# Patient Record
Sex: Female | Born: 1980 | Race: White | Hispanic: No | Marital: Single | Smoking: Former smoker
Health system: Southern US, Community
[De-identification: ages and names within clinical notes are randomized; demographics above are authoritative.]

## PROBLEM LIST (undated history)

## (undated) DIAGNOSIS — K219 Gastro-esophageal reflux disease without esophagitis: Secondary | ICD-10-CM

## (undated) DIAGNOSIS — F419 Anxiety disorder, unspecified: Secondary | ICD-10-CM

## (undated) HISTORY — PX: MOUTH SURGERY: SHX715

---

## 2004-10-08 HISTORY — PX: DILATION AND CURETTAGE OF UTERUS: SHX78

## 2004-10-30 ENCOUNTER — Emergency Department: Payer: Self-pay | Admitting: Emergency Medicine

## 2004-11-28 ENCOUNTER — Ambulatory Visit: Payer: Self-pay | Admitting: Unknown Physician Specialty

## 2004-12-12 ENCOUNTER — Emergency Department: Payer: Self-pay | Admitting: Emergency Medicine

## 2005-01-05 ENCOUNTER — Emergency Department: Payer: Self-pay | Admitting: Emergency Medicine

## 2009-08-03 ENCOUNTER — Emergency Department: Payer: Self-pay | Admitting: Unknown Physician Specialty

## 2010-09-30 ENCOUNTER — Emergency Department: Payer: Self-pay | Admitting: Emergency Medicine

## 2012-05-02 ENCOUNTER — Emergency Department: Payer: Self-pay

## 2012-11-15 ENCOUNTER — Emergency Department: Payer: Self-pay | Admitting: Emergency Medicine

## 2012-11-15 LAB — URINALYSIS, COMPLETE
Bilirubin,UR: NEGATIVE
Glucose,UR: NEGATIVE mg/dL (ref 0–75)
Nitrite: POSITIVE
Ph: 5 (ref 4.5–8.0)
Protein: NEGATIVE
RBC,UR: 215 /HPF (ref 0–5)
Specific Gravity: 1.015 (ref 1.003–1.030)
Squamous Epithelial: 1
WBC UR: 28 /HPF (ref 0–5)

## 2012-11-15 LAB — CBC
HGB: 12.7 g/dL (ref 12.0–16.0)
MCH: 29.6 pg (ref 26.0–34.0)
MCHC: 34.5 g/dL (ref 32.0–36.0)
MCV: 86 fL (ref 80–100)
RBC: 4.28 10*6/uL (ref 3.80–5.20)
RDW: 14.2 % (ref 11.5–14.5)
WBC: 8.6 10*3/uL (ref 3.6–11.0)

## 2012-11-15 LAB — BASIC METABOLIC PANEL
Anion Gap: 10 (ref 7–16)
Creatinine: 0.41 mg/dL — ABNORMAL LOW (ref 0.60–1.30)
EGFR (African American): >60
EGFR (Non-African Amer.): >60
Osmolality: 270 (ref 275–301)
Potassium: 3.6 mmol/L (ref 3.5–5.1)
Sodium: 137 mmol/L (ref 136–145)

## 2013-03-28 ENCOUNTER — Observation Stay: Payer: Self-pay | Admitting: Obstetrics and Gynecology

## 2013-04-09 ENCOUNTER — Inpatient Hospital Stay: Payer: Self-pay | Admitting: Obstetrics and Gynecology

## 2013-04-09 LAB — CBC WITH DIFFERENTIAL/PLATELET
Basophil #: 0 10*3/uL (ref 0.0–0.1)
Eosinophil #: 0 10*3/uL (ref 0.0–0.7)
Eosinophil %: 0.4 %
HGB: 11.6 g/dL — ABNORMAL LOW (ref 12.0–16.0)
MCH: 27.9 pg (ref 26.0–34.0)
MCHC: 33.8 g/dL (ref 32.0–36.0)
MCV: 83 fL (ref 80–100)
Monocyte #: 0.8 x10 3/mm (ref 0.2–0.9)
Monocyte %: 7.2 %
Neutrophil #: 8.7 10*3/uL — ABNORMAL HIGH (ref 1.4–6.5)
Neutrophil %: 79.8 %
Platelet: 177 10*3/uL (ref 150–440)
RBC: 4.17 10*6/uL (ref 3.80–5.20)
RDW: 14.2 % (ref 11.5–14.5)
WBC: 10.9 10*3/uL (ref 3.6–11.0)

## 2013-04-10 LAB — HEMATOCRIT: HCT: 32.1 % — ABNORMAL LOW (ref 35.0–47.0)

## 2014-10-08 NOTE — L&D Delivery Note (Signed)
Delivery Note At  a viable boy Olegario Shearer)  was delivered via  (NSVD , OA ).  APGAR: 9,9 ; weight 7LB 5 oz   Placenta status: Delivered Intact Shultz  Cord: 3 vessel with the following complications:none    Anesthesia:  Epidural Episiotomy:   none Lacerations:   superficial 1st degree no repair needed Suture Repair: none Est. Blood Loss (mL):   150 Mom to postpartum.  Baby to Couplet care / Skin to Skin   Delivered attended with Yolanda Bonine, CNM Cassandra Elder, RN-C, SNM.  Rolando Hessling Burr 06/22/2015, 2:32 PM

## 2014-10-26 LAB — OB RESULTS CONSOLE RUBELLA ANTIBODY, IGM: Rubella: NON-IMMUNE/NOT IMMUNE

## 2014-10-26 LAB — OB RESULTS CONSOLE ABO/RH

## 2014-10-26 LAB — OB RESULTS CONSOLE VARICELLA ZOSTER ANTIBODY, IGG: Varicella: IMMUNE

## 2014-10-26 LAB — OB RESULTS CONSOLE GC/CHLAMYDIA
Chlamydia: NEGATIVE
Gonorrhea: NEGATIVE

## 2014-10-26 LAB — OB RESULTS CONSOLE RPR: RPR: NONREACTIVE

## 2014-10-26 LAB — OB RESULTS CONSOLE HGB/HCT, BLOOD
HCT: 40 %
HEMOGLOBIN: 13.4 g/dL

## 2014-10-26 LAB — OB RESULTS CONSOLE PLATELET COUNT: Platelets: 258 10*3/uL

## 2014-10-26 LAB — OB RESULTS CONSOLE ANTIBODY SCREEN: ANTIBODY SCREEN: NEGATIVE

## 2014-10-26 LAB — OB RESULTS CONSOLE HEPATITIS B SURFACE ANTIGEN: Hepatitis B Surface Ag: NEGATIVE

## 2015-02-15 NOTE — H&P (Signed)
L&D Evaluation:  History Expanded:  HPI 34 yo G5P1031 at term w contractions since 1200.  No vb or rom.  [facility].  O+. TDaP UTD.   Blood Type (Maternal) O positive   Presents with contractions   Patient's Medical History HSV   Patient's Surgical History none   Medications Pre Natal Vitamins  Acyclovir   Allergies NKDA   Social History none   Family History Non-Contributory   ROS:  ROS All systems were reviewed.  HEENT, CNS, GI, GU, Respiratory, CV, Renal and Musculoskeletal systems were found to be normal.   Exam:  Vital Signs stable   General no apparent distress   Mental Status clear   Chest clear   Heart normal sinus rhythm, no murmur/gallop/rubs   Abdomen gravid, non-tender   Estimated Fetal Weight Average for gestational age   Back no CVAT   Edema no edema   Pelvic no external lesions, 7/90/-1   Mebranes Intact   FHT normal rate with no decels   Ucx regular   Impression:  Impression active labor   Plan:  Plan EFM/NST, monitor contractions and for cervical change, fluids   Comments Epidural, then spontaneous rupture of membranes, anticipate vag delivery   Electronic Signatures: Letitia LibraHarris, Caterra Ostroff Paul (MD)  (Signed 03-Jul-14 10:19)  Authored: L&D Evaluation   Last Updated: 03-Jul-14 10:19 by Letitia LibraHarris, Gwin Eagon Paul (MD)

## 2015-03-28 ENCOUNTER — Encounter: Payer: Self-pay | Admitting: *Deleted

## 2015-03-31 ENCOUNTER — Encounter: Payer: Self-pay | Admitting: Obstetrics and Gynecology

## 2015-03-31 ENCOUNTER — Ambulatory Visit (INDEPENDENT_AMBULATORY_CARE_PROVIDER_SITE_OTHER): Payer: No Typology Code available for payment source | Admitting: Obstetrics and Gynecology

## 2015-03-31 ENCOUNTER — Telehealth: Payer: Self-pay | Admitting: Obstetrics and Gynecology

## 2015-03-31 VITALS — BP 116/76 | HR 86 | Wt 187.4 lb

## 2015-03-31 DIAGNOSIS — B373 Candidiasis of vulva and vagina: Secondary | ICD-10-CM

## 2015-03-31 DIAGNOSIS — B3731 Acute candidiasis of vulva and vagina: Secondary | ICD-10-CM

## 2015-03-31 DIAGNOSIS — Z3493 Encounter for supervision of normal pregnancy, unspecified, third trimester: Secondary | ICD-10-CM | POA: Diagnosis not present

## 2015-03-31 LAB — POCT URINALYSIS DIPSTICK
Blood, UA: NEGATIVE
GLUCOSE UA: NEGATIVE
KETONES UA: NEGATIVE
Leukocytes, UA: NEGATIVE
Nitrite, UA: NEGATIVE
Protein, UA: 30
SPEC GRAV UA: 1.01
UROBILINOGEN UA: 0.2
pH, UA: 7

## 2015-03-31 MED ORDER — TETANUS-DIPHTH-ACELL PERTUSSIS 5-2.5-18.5 LF-MCG/0.5 IM SUSP: 0.5000 mL | Freq: Once | INTRAMUSCULAR | Status: DC

## 2015-03-31 MED ORDER — TERCONAZOLE 0.4 % VA CREA: 1.0000 | TOPICAL_CREAM | Freq: Every day | VAGINAL | Status: DC

## 2015-03-31 NOTE — Progress Notes (Signed)
Pt is c/o vaginal itching-outside vagina, some swelling b feet

## 2015-03-31 NOTE — Progress Notes (Signed)
ROB & Glucola- Tdap given- instructed to check with spouse and intended babysitter about their current TDAP; Blood consent signed; plans to name infant 'Ulanda Edison' , + yeast infection on exam with external vulva red- rx for terazol cream sent in;

## 2015-03-31 NOTE — Addendum Note (Signed)
Addended by: Rosine Beat L on: 03/31/2015 02:01 PM   Modules accepted: Orders

## 2015-04-01 LAB — HEMOGLOBIN AND HEMATOCRIT, BLOOD
HEMATOCRIT: 34.7 % (ref 34.0–46.6)
HEMOGLOBIN: 11.6 g/dL (ref 11.1–15.9)

## 2015-04-01 LAB — GLUCOSE, 1 HOUR GESTATIONAL: GESTATIONAL DIABETES SCREEN: 98 mg/dL (ref 65–139)

## 2015-04-14 ENCOUNTER — Ambulatory Visit (INDEPENDENT_AMBULATORY_CARE_PROVIDER_SITE_OTHER): Payer: No Typology Code available for payment source | Admitting: Obstetrics and Gynecology

## 2015-04-14 ENCOUNTER — Encounter: Payer: Self-pay | Admitting: Obstetrics and Gynecology

## 2015-04-14 VITALS — BP 128/75 | HR 100 | Wt 189.6 lb

## 2015-04-14 DIAGNOSIS — Z331 Pregnant state, incidental: Secondary | ICD-10-CM | POA: Diagnosis not present

## 2015-04-14 DIAGNOSIS — Z3493 Encounter for supervision of normal pregnancy, unspecified, third trimester: Secondary | ICD-10-CM

## 2015-04-14 LAB — POCT URINALYSIS DIPSTICK
Bilirubin, UA: NEGATIVE
Blood, UA: NEGATIVE
GLUCOSE UA: NEGATIVE
KETONES UA: NEGATIVE
Leukocytes, UA: NEGATIVE
Nitrite, UA: NEGATIVE
SPEC GRAV UA: 1.01
UROBILINOGEN UA: 0.2
pH, UA: 6.5

## 2015-04-14 NOTE — Progress Notes (Signed)
Pt denies any new complaints

## 2015-04-14 NOTE — Progress Notes (Signed)
ROB- doing well, all signs of yeast infection have resolved. Plans breastfeeding PP, desires OCPs (minipill) until finishes breastfeeding then Nuvaring., plans circumcision, FOB for labor support and desires epidural.

## 2015-04-28 ENCOUNTER — Encounter: Payer: Self-pay | Admitting: Obstetrics and Gynecology

## 2015-04-28 ENCOUNTER — Ambulatory Visit (INDEPENDENT_AMBULATORY_CARE_PROVIDER_SITE_OTHER): Payer: No Typology Code available for payment source | Admitting: Obstetrics and Gynecology

## 2015-04-28 VITALS — BP 109/71 | HR 74 | Wt 190.8 lb

## 2015-04-28 DIAGNOSIS — Z3493 Encounter for supervision of normal pregnancy, unspecified, third trimester: Secondary | ICD-10-CM

## 2015-04-28 LAB — POCT URINALYSIS DIPSTICK
Glucose, UA: NEGATIVE
KETONES UA: NEGATIVE
Leukocytes, UA: NEGATIVE
Nitrite, UA: NEGATIVE
RBC UA: NEGATIVE
Spec Grav, UA: 1.015
Urobilinogen, UA: 0.2
pH, UA: 6

## 2015-04-28 NOTE — Progress Notes (Signed)
Pt had a spell of nausea on Monday, lots of pelvic pressure, low back pain

## 2015-04-28 NOTE — Progress Notes (Signed)
ROB-doing well, unsure cause of nausea and back pain- reviewed s/s of UTI and vaginitis;

## 2015-05-13 ENCOUNTER — Ambulatory Visit (INDEPENDENT_AMBULATORY_CARE_PROVIDER_SITE_OTHER): Payer: No Typology Code available for payment source | Admitting: Obstetrics and Gynecology

## 2015-05-13 ENCOUNTER — Encounter: Payer: Self-pay | Admitting: Obstetrics and Gynecology

## 2015-05-13 VITALS — BP 111/73 | HR 97 | Wt 198.3 lb

## 2015-05-13 DIAGNOSIS — Z3493 Encounter for supervision of normal pregnancy, unspecified, third trimester: Secondary | ICD-10-CM

## 2015-05-13 LAB — POCT URINALYSIS DIPSTICK
Bilirubin, UA: NEGATIVE
GLUCOSE UA: NEGATIVE
KETONES UA: NEGATIVE
Leukocytes, UA: NEGATIVE
Nitrite, UA: NEGATIVE
Protein, UA: 6
Spec Grav, UA: 1.01
Urobilinogen, UA: 0.2
pH, UA: 6

## 2015-05-13 NOTE — Progress Notes (Signed)
ROB- doing well, discussed wt gain- to increase water intake and reduce carb's; will plan on delivery at due date due to previous 9#6oz female at [redacted]w[redacted]d

## 2015-05-13 NOTE — Progress Notes (Signed)
Pt has gained some weight this time, having some pelvic pressure

## 2015-05-27 ENCOUNTER — Encounter: Payer: Self-pay | Admitting: Obstetrics and Gynecology

## 2015-05-27 ENCOUNTER — Ambulatory Visit (INDEPENDENT_AMBULATORY_CARE_PROVIDER_SITE_OTHER): Payer: No Typology Code available for payment source | Admitting: Obstetrics and Gynecology

## 2015-05-27 VITALS — BP 115/71 | HR 76 | Wt 199.8 lb

## 2015-05-27 DIAGNOSIS — Z36 Encounter for antenatal screening of mother: Secondary | ICD-10-CM

## 2015-05-27 DIAGNOSIS — O26893 Other specified pregnancy related conditions, third trimester: Secondary | ICD-10-CM | POA: Insufficient documentation

## 2015-05-27 DIAGNOSIS — Z113 Encounter for screening for infections with a predominantly sexual mode of transmission: Secondary | ICD-10-CM

## 2015-05-27 DIAGNOSIS — Z3493 Encounter for supervision of normal pregnancy, unspecified, third trimester: Secondary | ICD-10-CM

## 2015-05-27 DIAGNOSIS — Z3685 Encounter for antenatal screening for Streptococcus B: Secondary | ICD-10-CM

## 2015-05-27 DIAGNOSIS — R12 Heartburn: Secondary | ICD-10-CM

## 2015-05-27 LAB — POCT URINALYSIS DIPSTICK
BILIRUBIN UA: NEGATIVE
Blood, UA: NEGATIVE
GLUCOSE UA: NEGATIVE
Ketones, UA: NEGATIVE
LEUKOCYTES UA: NEGATIVE
Nitrite, UA: NEGATIVE
Protein, UA: NEGATIVE
Spec Grav, UA: 1.01
Urobilinogen, UA: 0.2
pH, UA: 6.5

## 2015-05-27 MED ORDER — RANITIDINE HCL 150 MG PO TABS: 150.0000 mg | ORAL_TABLET | Freq: Two times a day (BID) | ORAL | Status: DC

## 2015-05-27 NOTE — Progress Notes (Signed)
ROB-c/o heartburn, denies any other complaints

## 2015-05-27 NOTE — Progress Notes (Signed)
ROB- cultures obtained, FMLA papers brought in; labor precautions reiterated.

## 2015-05-29 LAB — STREP GP B NAA: Strep Gp B NAA: NEGATIVE

## 2015-05-29 LAB — GC/CHLAMYDIA PROBE AMP
Chlamydia trachomatis, NAA: NEGATIVE
NEISSERIA GONORRHOEAE BY PCR: NEGATIVE

## 2015-06-03 ENCOUNTER — Encounter: Payer: Self-pay | Admitting: Obstetrics and Gynecology

## 2015-06-03 ENCOUNTER — Ambulatory Visit (INDEPENDENT_AMBULATORY_CARE_PROVIDER_SITE_OTHER): Payer: No Typology Code available for payment source | Admitting: Obstetrics and Gynecology

## 2015-06-03 VITALS — BP 106/70 | HR 68 | Wt 201.9 lb

## 2015-06-03 DIAGNOSIS — Z331 Pregnant state, incidental: Secondary | ICD-10-CM

## 2015-06-03 DIAGNOSIS — R894 Abnormal immunological findings in specimens from other organs, systems and tissues: Secondary | ICD-10-CM

## 2015-06-03 DIAGNOSIS — R768 Other specified abnormal immunological findings in serum: Secondary | ICD-10-CM

## 2015-06-03 LAB — POCT URINALYSIS DIPSTICK
Blood, UA: NEGATIVE
Glucose, UA: NEGATIVE
KETONES UA: NEGATIVE
Leukocytes, UA: NEGATIVE
Nitrite, UA: NEGATIVE
PH UA: 6
SPEC GRAV UA: 1.025
UROBILINOGEN UA: 0.2

## 2015-06-03 MED ORDER — FLUCONAZOLE 150 MG PO TABS: 150.0000 mg | ORAL_TABLET | Freq: Once | ORAL | Status: DC

## 2015-06-03 MED ORDER — ACYCLOVIR 400 MG PO TABS: 400.0000 mg | ORAL_TABLET | Freq: Two times a day (BID) | ORAL | Status: DC

## 2015-06-03 NOTE — Progress Notes (Signed)
ROB- yeast infection has returned; starting HSV prophylaxis; will plan IOL at term due to previous 9#6oz infant with extensive vaginal repair (scheduled 9/14 at 630am). Work note to reduce hours to 5/day for remainder of pregnancy.

## 2015-06-03 NOTE — Progress Notes (Signed)
ROB-pt is having some contractions, c/o vaginal itching

## 2015-06-09 ENCOUNTER — Encounter: Payer: No Typology Code available for payment source | Admitting: Obstetrics and Gynecology

## 2015-06-09 ENCOUNTER — Encounter: Payer: Self-pay | Admitting: Obstetrics and Gynecology

## 2015-06-09 ENCOUNTER — Ambulatory Visit (INDEPENDENT_AMBULATORY_CARE_PROVIDER_SITE_OTHER): Payer: No Typology Code available for payment source | Admitting: Obstetrics and Gynecology

## 2015-06-09 VITALS — BP 117/72 | HR 75 | Wt 202.0 lb

## 2015-06-09 DIAGNOSIS — Z331 Pregnant state, incidental: Secondary | ICD-10-CM

## 2015-06-09 LAB — POCT URINALYSIS DIPSTICK
Bilirubin, UA: NEGATIVE
Glucose, UA: NEGATIVE
Ketones, UA: NEGATIVE
LEUKOCYTES UA: NEGATIVE
Nitrite, UA: NEGATIVE
PH UA: 7
Spec Grav, UA: 1.01
UROBILINOGEN UA: 0.2

## 2015-06-09 NOTE — Progress Notes (Signed)
ROB- doing well, labor precautions reiterated. 

## 2015-06-09 NOTE — Progress Notes (Signed)
ROB- pt is having some pelvic pressure 

## 2015-06-17 ENCOUNTER — Encounter: Payer: Self-pay | Admitting: Obstetrics and Gynecology

## 2015-06-17 ENCOUNTER — Ambulatory Visit (INDEPENDENT_AMBULATORY_CARE_PROVIDER_SITE_OTHER): Payer: No Typology Code available for payment source | Admitting: Obstetrics and Gynecology

## 2015-06-17 VITALS — BP 118/72 | HR 77 | Wt 207.6 lb

## 2015-06-17 DIAGNOSIS — Z3493 Encounter for supervision of normal pregnancy, unspecified, third trimester: Secondary | ICD-10-CM

## 2015-06-17 LAB — POCT URINALYSIS DIPSTICK
Bilirubin, UA: NEGATIVE
Blood, UA: NEGATIVE
Glucose, UA: NEGATIVE
Ketones, UA: NEGATIVE
LEUKOCYTES UA: NEGATIVE
NITRITE UA: NEGATIVE
PROTEIN UA: NEGATIVE
Spec Grav, UA: 1.01
UROBILINOGEN UA: 0.2
pH, UA: 6

## 2015-06-17 MED ORDER — INFLUENZA VAC SPLIT QUAD 0.5 ML IM SUSY
0.5000 mL | PREFILLED_SYRINGE | Freq: Once | INTRAMUSCULAR | Status: AC
  Administered 2015-06-17: 0.5 mL via INTRAMUSCULAR

## 2015-06-17 NOTE — Progress Notes (Signed)
ROB- doing well, labor precautions 

## 2015-06-17 NOTE — Progress Notes (Signed)
ROB- denies any new complaints 

## 2015-06-22 ENCOUNTER — Inpatient Hospital Stay: Payer: PRIVATE HEALTH INSURANCE | Admitting: Anesthesiology

## 2015-06-22 ENCOUNTER — Encounter: Payer: Self-pay | Admitting: *Deleted

## 2015-06-22 ENCOUNTER — Inpatient Hospital Stay
Admission: AD | Admit: 2015-06-22 | Discharge: 2015-06-23 | DRG: 775 | Disposition: A | Discharge status: Home / Self Care | Payer: PRIVATE HEALTH INSURANCE | Source: Ambulatory Visit | Attending: Obstetrics and Gynecology | Admitting: Obstetrics and Gynecology

## 2015-06-22 DIAGNOSIS — O480 Post-term pregnancy: Secondary | ICD-10-CM | POA: Diagnosis present

## 2015-06-22 DIAGNOSIS — Z3493 Encounter for supervision of normal pregnancy, unspecified, third trimester: Secondary | ICD-10-CM

## 2015-06-22 DIAGNOSIS — Z3A40 40 weeks gestation of pregnancy: Secondary | ICD-10-CM | POA: Diagnosis not present

## 2015-06-22 DIAGNOSIS — O99334 Smoking (tobacco) complicating childbirth: Secondary | ICD-10-CM | POA: Diagnosis present

## 2015-06-22 DIAGNOSIS — Z803 Family history of malignant neoplasm of breast: Secondary | ICD-10-CM | POA: Diagnosis not present

## 2015-06-22 LAB — TYPE AND SCREEN
ABO/RH(D): O POS
Antibody Screen: NEGATIVE

## 2015-06-22 LAB — CBC
HEMATOCRIT: 33.6 % — AB (ref 35.0–47.0)
HEMOGLOBIN: 11.1 g/dL — AB (ref 12.0–16.0)
MCH: 27.7 pg (ref 26.0–34.0)
MCHC: 33 g/dL (ref 32.0–36.0)
MCV: 84 fL (ref 80.0–100.0)
Platelets: 200 10*3/uL (ref 150–440)
RBC: 4 MIL/uL (ref 3.80–5.20)
RDW: 13.7 % (ref 11.5–14.5)
WBC: 6.4 10*3/uL (ref 3.6–11.0)

## 2015-06-22 LAB — ABO/RH: ABO/RH(D): O POS

## 2015-06-22 MED ORDER — AMMONIA AROMATIC IN INHA
RESPIRATORY_TRACT | Status: AC
  Filled 2015-06-22: qty 10

## 2015-06-22 MED ORDER — OXYTOCIN 10 UNIT/ML IJ SOLN
INTRAMUSCULAR | Status: AC
  Filled 2015-06-22: qty 2

## 2015-06-22 MED ORDER — BUPIVACAINE HCL (PF) 0.25 % IJ SOLN
INTRAMUSCULAR | Status: DC | PRN
  Administered 2015-06-22: 5 mL via EPIDURAL
  Administered 2015-06-22: 2 mL via EPIDURAL

## 2015-06-22 MED ORDER — DIBUCAINE 1 % RE OINT: 1.0000 "application " | TOPICAL_OINTMENT | RECTAL | Status: DC | PRN

## 2015-06-22 MED ORDER — OXYCODONE-ACETAMINOPHEN 5-325 MG PO TABS
2.0000 | ORAL_TABLET | ORAL | Status: DC | PRN
  Administered 2015-06-23: 2 via ORAL
  Filled 2015-06-22: qty 2

## 2015-06-22 MED ORDER — ACETAMINOPHEN 325 MG PO TABS
650.0000 mg | ORAL_TABLET | ORAL | Status: DC | PRN
  Administered 2015-06-22: 650 mg via ORAL
  Filled 2015-06-22: qty 2

## 2015-06-22 MED ORDER — MISOPROSTOL 200 MCG PO TABS
ORAL_TABLET | ORAL | Status: AC
  Filled 2015-06-22: qty 4

## 2015-06-22 MED ORDER — LIDOCAINE-EPINEPHRINE (PF) 1.5 %-1:200000 IJ SOLN
INTRAMUSCULAR | Status: DC | PRN
  Administered 2015-06-22 (×2): 2 mL via PERINEURAL

## 2015-06-22 MED ORDER — ONDANSETRON HCL 4 MG/2ML IJ SOLN: 4.0000 mg | Freq: Four times a day (QID) | INTRAMUSCULAR | Status: DC | PRN

## 2015-06-22 MED ORDER — LACTATED RINGERS IV SOLN: 500.0000 mL | INTRAVENOUS | Status: DC | PRN

## 2015-06-22 MED ORDER — ONDANSETRON HCL 4 MG/2ML IJ SOLN: 4.0000 mg | INTRAMUSCULAR | Status: DC | PRN

## 2015-06-22 MED ORDER — OXYTOCIN 40 UNITS IN LACTATED RINGERS INFUSION - SIMPLE MED
1.0000 m[IU]/min | INTRAVENOUS | Status: DC
  Administered 2015-06-22: 1 m[IU]/min via INTRAVENOUS
  Filled 2015-06-22: qty 1000

## 2015-06-22 MED ORDER — BUTORPHANOL TARTRATE 1 MG/ML IJ SOLN
INTRAMUSCULAR | Status: AC
  Administered 2015-06-22: 1 mg via INTRAVENOUS
  Filled 2015-06-22: qty 1

## 2015-06-22 MED ORDER — LIDOCAINE HCL (PF) 1 % IJ SOLN
30.0000 mL | INTRAMUSCULAR | Status: DC | PRN
  Filled 2015-06-22: qty 30

## 2015-06-22 MED ORDER — OXYTOCIN 40 UNITS IN LACTATED RINGERS INFUSION - SIMPLE MED: 62.5000 mL/h | INTRAVENOUS | Status: DC | PRN

## 2015-06-22 MED ORDER — OXYCODONE-ACETAMINOPHEN 5-325 MG PO TABS
1.0000 | ORAL_TABLET | ORAL | Status: DC | PRN
  Administered 2015-06-23 (×2): 1 via ORAL
  Filled 2015-06-22 (×2): qty 1

## 2015-06-22 MED ORDER — FENTANYL 2.5 MCG/ML W/ROPIVACAINE 0.2% IN NS 100 ML EPIDURAL INFUSION (ARMC-ANES)
EPIDURAL | Status: AC
  Administered 2015-06-22: 20 mL/h
  Administered 2015-06-22: 10 mL/h via EPIDURAL
  Filled 2015-06-22: qty 100

## 2015-06-22 MED ORDER — OXYTOCIN 40 UNITS IN LACTATED RINGERS INFUSION - SIMPLE MED: 62.5000 mL/h | INTRAVENOUS | Status: DC

## 2015-06-22 MED ORDER — ONDANSETRON HCL 4 MG PO TABS: 4.0000 mg | ORAL_TABLET | ORAL | Status: DC | PRN

## 2015-06-22 MED ORDER — WITCH HAZEL-GLYCERIN EX PADS: 1.0000 "application " | MEDICATED_PAD | CUTANEOUS | Status: DC | PRN

## 2015-06-22 MED ORDER — LIDOCAINE HCL (PF) 1 % IJ SOLN
INTRAMUSCULAR | Status: AC
Start: 2015-06-22 — End: 2015-06-22
  Filled 2015-06-22: qty 30

## 2015-06-22 MED ORDER — SENNOSIDES-DOCUSATE SODIUM 8.6-50 MG PO TABS: 2.0000 | ORAL_TABLET | ORAL | Status: DC

## 2015-06-22 MED ORDER — OXYTOCIN BOLUS FROM INFUSION
500.0000 mL | INTRAVENOUS | Status: DC
  Administered 2015-06-22: 500 mL via INTRAVENOUS

## 2015-06-22 MED ORDER — BUTORPHANOL TARTRATE 1 MG/ML IJ SOLN
1.0000 mg | Freq: Once | INTRAMUSCULAR | Status: AC
  Administered 2015-06-22: 1 mg via INTRAVENOUS

## 2015-06-22 MED ORDER — IBUPROFEN 600 MG PO TABS
600.0000 mg | ORAL_TABLET | Freq: Four times a day (QID) | ORAL | Status: DC
  Administered 2015-06-22 – 2015-06-23 (×3): 600 mg via ORAL
  Filled 2015-06-22 (×3): qty 1

## 2015-06-22 MED ORDER — PRENATAL MULTIVITAMIN CH
1.0000 | ORAL_TABLET | Freq: Every day | ORAL | Status: DC
  Administered 2015-06-23: 1 via ORAL
  Filled 2015-06-22: qty 1

## 2015-06-22 MED ORDER — LACTATED RINGERS IV SOLN
INTRAVENOUS | Status: DC
  Administered 2015-06-22 (×2): via INTRAVENOUS

## 2015-06-22 MED ORDER — LANOLIN HYDROUS EX OINT: TOPICAL_OINTMENT | CUTANEOUS | Status: DC | PRN

## 2015-06-22 MED ORDER — TERBUTALINE SULFATE 1 MG/ML IJ SOLN: 0.2500 mg | Freq: Once | INTRAMUSCULAR | Status: DC | PRN

## 2015-06-22 MED ORDER — BENZOCAINE-MENTHOL 20-0.5 % EX AERO: 1.0000 "application " | INHALATION_SPRAY | CUTANEOUS | Status: DC | PRN

## 2015-06-22 MED ORDER — SIMETHICONE 80 MG PO CHEW: 80.0000 mg | CHEWABLE_TABLET | ORAL | Status: DC | PRN

## 2015-06-22 MED ORDER — DIPHENHYDRAMINE HCL 25 MG PO CAPS: 25.0000 mg | ORAL_CAPSULE | Freq: Four times a day (QID) | ORAL | Status: DC | PRN

## 2015-06-22 NOTE — Anesthesia Procedure Notes (Signed)
Epidural Patient location during procedure: OB Start time: 06/22/2015 12:54 PM End time: 06/22/2015 1:15 PM  Staffing Anesthesiologist: Elijio Miles F Performed by: anesthesiologist   Preanesthetic Checklist Completed: patient identified, site marked, surgical consent, pre-op evaluation, timeout performed, IV checked, risks and benefits discussed, monitors and equipment checked and at surgeon's request  Epidural Patient position: sitting Prep: Betadine Patient monitoring: heart rate and blood pressure Approach: midline Location: L3-L4 Injection technique: LOR air and LOR saline  Needle:  Needle type: Tuohy  Needle gauge: 18 G Needle length: 9 cm Needle insertion depth: 5 cm Catheter type: closed end flexible Catheter size: 20 Guage Catheter at skin depth: 9 cm Test dose: negative and 2% lidocaine with Epi 1:200 K  Assessment Sensory level: T9  Additional Notes Reason for block:at surgeon's request

## 2015-06-22 NOTE — Anesthesia Preprocedure Evaluation (Signed)
Anesthesia Evaluation  Patient identified by MRN, date of birth, ID band Patient awake    Reviewed: Allergy & Precautions, NPO status , Patient's Chart, lab work & pertinent test results  History of Anesthesia Complications Negative for: history of anesthetic complications  Airway Mallampati: II       Dental no notable dental hx.    Pulmonary neg pulmonary ROS, Current Smoker,    Pulmonary exam normal        Cardiovascular negative cardio ROS       Neuro/Psych    GI/Hepatic negative GI ROS, Neg liver ROS,   Endo/Other  negative endocrine ROS  Renal/GU negative Renal ROS     Musculoskeletal   Abdominal Normal abdominal exam  (+)   Peds negative pediatric ROS (+)  Hematology negative hematology ROS (+)   Anesthesia Other Findings   Reproductive/Obstetrics                             Anesthesia Physical Anesthesia Plan  ASA: II  Anesthesia Plan: Epidural   Post-op Pain Management:    Induction:   Airway Management Planned: Natural Airway  Additional Equipment:   Intra-op Plan:   Post-operative Plan:   Informed Consent: I have reviewed the patients History and Physical, chart, labs and discussed the procedure including the risks, benefits and alternatives for the proposed anesthesia with the patient or authorized representative who has indicated his/her understanding and acceptance.     Plan Discussed with: CRNA  Anesthesia Plan Comments:         Anesthesia Quick Evaluation

## 2015-06-22 NOTE — H&P (Signed)
Obstetric History and Physical  Mary Cooper is a 34 y.o. [id number] with IUP at [redacted]w[redacted]d presenting with IOL at term due to previous postdates and LGA. Patient states she has been having  irregular, every 7 minutes contractions, none vaginal bleeding, intact membranes, with active fetal movement.    Prenatal Course [facility] Care: [facility]  Pregnancy complications or risks:previous LGA infant HSV + on prophylaxis  Prenatal labs and studies: ABO, Rh: O/--/-- (01/19 0000) Antibody: Negative (01/19 0000) Rubella: Nonimmune (01/19 0000) RPR: Nonreactive (01/19 0000)  HBsAg: Negative (01/19 0000)  HIV:   negative GBS: negative 1 hr Glucola  normal Genetic screening normal Anatomy US normal  No past medical history on file.  Past Surgical History  Procedure Laterality Date  . Dilation and curettage of uterus  2006    OB History  Gravida Para Term Preterm AB SAB TAB Ectopic Multiple Living  5    2 1 1   2     # Outcome Date GA Lbr Len/2nd Weight Sex Delivery Anes PTL Lv  5 Current           4 Gravida 2014    M    Y  3 TAB           2 SAB           1 Gravida     M Vag-Spont   Y      Social History   Social History  . Marital Status: Single    Spouse Name: N/A  . Number of Children: N/A  . Years of Education: N/A   Social History Main Topics  . Smoking status: Current Every Day Smoker  . Smokeless tobacco: Never Used  . Alcohol Use: No  . Drug Use: No  . Sexual Activity: Yes    Birth Control/ Protection: None   Other Topics Concern  . Not on file   Social History Narrative    Family History  Problem Relation Age of Onset  . Cancer Paternal Grandmother     BREAST    Facility-administered medications prior to admission  Medication Dose Route Frequency Provider Last Rate Last Dose  . Tdap (BOOSTRIX) injection 0.5 mL  0.5 mL Intramuscular Once Shealyn Sean [employer], CNM       Prescriptions prior to admission  Medication Sig Dispense Refill Last Dose  . acyclovir (ZOVIRAX)  400 MG tablet Take 1 tablet (400 mg total) by mouth 2 (two) times daily. 60 tablet 1 Taking  . fluconazole (DIFLUCAN) 150 MG tablet Take 1 tablet (150 mg total) by mouth once. Can take additional dose three days later if symptoms persist (Patient not taking: Reported on 06/09/2015) 1 tablet 3 Not Taking  . Prenatal Vit-Fe Fumarate-FA (PRENATAL MULTIVITAMIN) TABS tablet Take 1 tablet by mouth daily at 12 noon.   Taking  . ranitidine (ZANTAC) 150 MG tablet Take 1 tablet (150 mg total) by mouth 2 (two) times daily. 60 tablet 2 Taking  . terconazole (TERAZOL 7) 0.4 % vaginal cream Place 1 applicator vaginally at bedtime. (Patient not taking: Reported on 04/14/2015) 45 g 0 Not Taking    No Known Allergies  Review of Systems: Negative except for what is mentioned in HPI.  Physical Exam: BP 115/68 mmHg  Pulse 90  Temp(Src) 97.6 F (36.4 C) (Oral)  Resp 16  Ht 5\' 4"  (1.626 m)  Wt 93.895 kg (207 lb)  BMI 35.51 kg/m2  LMP 09/15/2014 GENERAL: Well-developed, well-nourished female in no acute distress.  LUNGS:  Clear to auscultation bilaterally.  HEART: Regular rate and rhythm. ABDOMEN: Soft, nontender, nondistended, gravid. EXTREMITIES: Nontender, no edema, 2+ distal pulses. Cervical Exam:   FHT:  Baseline rate 135 bpm   Variability moderate  Accelerations present   Decelerations none Contractions: Every 8 mins   Pertinent Labs/Studies:   No results found for this or any previous visit (from the past 24 hour(s)).  Assessment : Mary Cooper is a 34 y.o. [id number] at [redacted]w[redacted]d being admitted for labor.  Plan: Labor: Expectant management.  Induction/Augmentation as needed, per protocol FWB: Reassuring fetal heart tracing.  GBS negative Delivery plan: Hopeful for vaginal delivery  Editha Bridgeforth Ines Bloomer, CNM [facility], [facility]

## 2015-06-22 NOTE — Progress Notes (Signed)
Mary Cooper is a 34 y.o. [id number] at [redacted]w[redacted]d by LMP admitted for induction of labor due to previous LGA infant at 40+ weeks.  Subjective:   Objective: BP 108/65 mmHg  Pulse 78  Temp(Src) 97.6 F (36.4 C) (Oral)  Resp 16  Ht  (1.626 m)  Wt 93.895 kg (207 lb)  BMI 35.51 kg/m2  LMP 09/15/2014      Fetal Wellbeing:  Category I UC:   regular, every 4 minutes on 41mu/min of pitocin SVE:   Dilation: 8.5 Effacement (%): 90 Station: -1 Exam by:: Donnetta Hail CNM  Labs: Lab Results  Component Value Date   WBC 6.4 06/22/2015   HGB 11.1* 06/22/2015   HCT 33.6* 06/22/2015   MCV 84.0 06/22/2015   PLT 200 06/22/2015    Assessment / Plan: Induction of labor due to see above,  progressing well on pitocin  Labor: Progressing on Pitocin, will continue to increase then AROM-AROM done on exam and clear fluid noted Preeclampsia:  labs stable Pain Control:  Epidural Anticipated MOD:  NSVD  Mary Cooper 06/22/2015, 1:40 PM

## 2015-06-23 LAB — CBC
HCT: 32.9 % — ABNORMAL LOW (ref 35.0–47.0)
HEMOGLOBIN: 10.7 g/dL — AB (ref 12.0–16.0)
MCH: 27.3 pg (ref 26.0–34.0)
MCHC: 32.5 g/dL (ref 32.0–36.0)
MCV: 84.2 fL (ref 80.0–100.0)
Platelets: 176 10*3/uL (ref 150–440)
RBC: 3.91 MIL/uL (ref 3.80–5.20)
RDW: 13.7 % (ref 11.5–14.5)
WBC: 8.5 10*3/uL (ref 3.6–11.0)

## 2015-06-23 LAB — RPR: RPR: NONREACTIVE

## 2015-06-23 MED ORDER — NORETHINDRONE 0.35 MG PO TABS: 1.0000 | ORAL_TABLET | Freq: Every day | ORAL | Status: DC

## 2015-06-23 MED ORDER — FENTANYL 2.5 MCG/ML W/ROPIVACAINE 0.2% IN NS 100 ML EPIDURAL INFUSION (ARMC-ANES): 10.0000 mL/h | EPIDURAL | Status: DC

## 2015-06-23 MED ORDER — PHENYLEPHRINE 40 MCG/ML (10ML) SYRINGE FOR IV PUSH (FOR BLOOD PRESSURE SUPPORT): 80.0000 ug | PREFILLED_SYRINGE | INTRAVENOUS | Status: DC | PRN

## 2015-06-23 MED ORDER — ACYCLOVIR 400 MG PO TABS
400.0000 mg | ORAL_TABLET | Freq: Two times a day (BID) | ORAL | Status: DC
  Filled 2015-06-23 (×2): qty 1

## 2015-06-23 MED ORDER — EPHEDRINE 5 MG/ML INJ: 10.0000 mg | INTRAVENOUS | Status: DC | PRN

## 2015-06-23 MED ORDER — ACYCLOVIR 200 MG PO CAPS
400.0000 mg | ORAL_CAPSULE | Freq: Two times a day (BID) | ORAL | Status: DC
  Administered 2015-06-23: 400 mg via ORAL
  Filled 2015-06-23 (×2): qty 2

## 2015-06-23 MED ORDER — DIPHENHYDRAMINE HCL 50 MG/ML IJ SOLN: 12.5000 mg | INTRAMUSCULAR | Status: DC | PRN

## 2015-06-23 MED ORDER — IBUPROFEN 600 MG PO TABS: 600.0000 mg | ORAL_TABLET | Freq: Four times a day (QID) | ORAL | Status: DC

## 2015-06-23 MED ORDER — DOCUSATE SODIUM 100 MG PO CAPS: 100.0000 mg | ORAL_CAPSULE | Freq: Two times a day (BID) | ORAL | Status: DC | PRN

## 2015-06-23 NOTE — Lactation Note (Signed)
This note was copied from the chart of Mary Cooper. Lactation Consultation Note  Patient Name: Mary Beverely Suen [id number] Date: 06/23/2015     Maternal Data   Mother declined assistance  Feeding Feeding Type: Breast Fed Length of feed: 15 min  LATCH Score/Interventions                      Lactation Tools Discussed/Used     Consult Status      Trudee Grip 06/23/2015, 3:11 PM

## 2015-06-23 NOTE — Discharge Instructions (Signed)

## 2015-06-23 NOTE — Progress Notes (Signed)
Pt discharged home with infant.  Discharge instructions and follow up appointment given to and reviewed with pt.  Pt verbalized understanding.  Escorted by auxillary. 

## 2015-06-23 NOTE — Discharge Summary (Signed)
Obstetric Discharge Summary Reason for Admission: induction of labor Prenatal Procedures: ultrasound Intrapartum Procedures: spontaneous vaginal delivery Postpartum Procedures: none Complications-Operative and Postpartum: none HEMOGLOBIN  Date Value Ref Range Status  06/23/2015 10.7* 12.0 - 16.0 g/dL Final  96/01/5408 81.1 g/dL Final   HGB  Date Value Ref Range Status  04/09/2013 11.6* 12.0-16.0 g/dL Final   HCT  Date Value Ref Range Status  06/23/2015 32.9* 35.0 - 47.0 % Final  10/26/2014 40 % Final  04/10/2013 32.1* 35.0-47.0 % Final   HEMATOCRIT  Date Value Ref Range Status  03/31/2015 34.7 34.0 - 46.6 % Final    Physical Exam:  General: alert, cooperative, appears stated age and fatigued Lochia: appropriate Uterine Fundus: firm Incision: NA DVT Evaluation: No evidence of DVT seen on physical exam. Negative Homan's sign.  Discharge Diagnoses: Term Pregnancy-delivered  Discharge Information: Date: 06/23/2015 Activity: pelvic rest Diet: routine Medications: PNV, Ibuprofen and Colace Camila Condition: stable Instructions: refer to practice specific booklet Discharge to: home   Newborn Data: Live born female  Birth Weight: 7 lb 10.1 oz (3460 g) APGAR: 9, 9  Home with mother. Circumcision planned after discharge  Mary Cooper 06/23/2015, 1:30 PM   I have reviewed the record and concur with patient management and plan. Akia Desroches, Daphine Deutscher, MD, Evern Core

## 2015-06-23 NOTE — Anesthesia Postprocedure Evaluation (Signed)
  Anesthesia Post-op Note  Patient: Mary Cooper  Procedure(s) Performed: * No procedures listed *  Anesthesia type:Epidural  Patient location: 341   Post pain: Pain level controlled  Post assessment: Post-op Vital signs reviewed, Patient's Cardiovascular Status Stable, Respiratory Function Stable, Patent Airway and No signs of Nausea or vomiting  Post vital signs: Reviewed and stable  Last Vitals:  Filed Vitals:   06/23/15 0400  BP: 112/71  Pulse: 65  Temp: 36.4 C  Resp: 17    Level of consciousness: awake, alert  and patient cooperative  Complications: No apparent anesthesia complications

## 2015-06-30 ENCOUNTER — Encounter: Payer: Self-pay | Admitting: Emergency Medicine

## 2015-06-30 ENCOUNTER — Emergency Department: Payer: PRIVATE HEALTH INSURANCE

## 2015-06-30 ENCOUNTER — Emergency Department
Admission: EM | Admit: 2015-06-30 | Discharge: 2015-06-30 | Disposition: A | Discharge status: Home / Self Care | Payer: PRIVATE HEALTH INSURANCE | Attending: Emergency Medicine | Admitting: Emergency Medicine

## 2015-06-30 DIAGNOSIS — R1032 Left lower quadrant pain: Secondary | ICD-10-CM | POA: Insufficient documentation

## 2015-06-30 DIAGNOSIS — O9089 Other complications of the puerperium, not elsewhere classified: Secondary | ICD-10-CM | POA: Diagnosis present

## 2015-06-30 DIAGNOSIS — Z720 Tobacco use: Secondary | ICD-10-CM | POA: Insufficient documentation

## 2015-06-30 DIAGNOSIS — R109 Unspecified abdominal pain: Secondary | ICD-10-CM

## 2015-06-30 LAB — URINALYSIS COMPLETE WITH MICROSCOPIC (ARMC ONLY)
Bacteria, UA: NONE SEEN
Bilirubin Urine: NEGATIVE
GLUCOSE, UA: NEGATIVE mg/dL
KETONES UR: NEGATIVE mg/dL
NITRITE: NEGATIVE
PROTEIN: NEGATIVE mg/dL
SPECIFIC GRAVITY, URINE: 1.01 (ref 1.005–1.030)
pH: 7 (ref 5.0–8.0)

## 2015-06-30 LAB — COMPREHENSIVE METABOLIC PANEL
ALT: 16 U/L (ref 14–54)
AST: 22 U/L (ref 15–41)
Albumin: 3.6 g/dL (ref 3.5–5.0)
Alkaline Phosphatase: 102 U/L (ref 38–126)
Anion gap: 10 (ref 5–15)
BILIRUBIN TOTAL: 0.2 mg/dL — AB (ref 0.3–1.2)
BUN: 17 mg/dL (ref 6–20)
CHLORIDE: 105 mmol/L (ref 101–111)
CO2: 24 mmol/L (ref 22–32)
Calcium: 9.2 mg/dL (ref 8.9–10.3)
Creatinine, Ser: 0.6 mg/dL (ref 0.44–1.00)
Glucose, Bld: 94 mg/dL (ref 65–99)
POTASSIUM: 4.3 mmol/L (ref 3.5–5.1)
Sodium: 139 mmol/L (ref 135–145)
TOTAL PROTEIN: 7.8 g/dL (ref 6.5–8.1)

## 2015-06-30 LAB — CBC WITH DIFFERENTIAL/PLATELET
BASOS ABS: 0.1 10*3/uL (ref 0–0.1)
Basophils Relative: 1 %
EOS ABS: 0.1 10*3/uL (ref 0–0.7)
Eosinophils Relative: 1 %
HEMATOCRIT: 38.2 % (ref 35.0–47.0)
Hemoglobin: 12.3 g/dL (ref 12.0–16.0)
Lymphocytes Relative: 26 %
Lymphs Abs: 1.8 10*3/uL (ref 1.0–3.6)
MCH: 27.1 pg (ref 26.0–34.0)
MCHC: 32.2 g/dL (ref 32.0–36.0)
MCV: 84.3 fL (ref 80.0–100.0)
Monocytes Absolute: 0.5 10*3/uL (ref 0.2–0.9)
Monocytes Relative: 8 %
NEUTROS PCT: 64 %
Neutro Abs: 4.5 10*3/uL (ref 1.4–6.5)
PLATELETS: 278 10*3/uL (ref 150–440)
RBC: 4.53 MIL/uL (ref 3.80–5.20)
RDW: 14 % (ref 11.5–14.5)
WBC: 7.1 10*3/uL (ref 3.6–11.0)

## 2015-06-30 LAB — HCG, QUANTITATIVE, PREGNANCY: HCG, BETA CHAIN, QUANT, S: 1 m[IU]/mL (ref <5)

## 2015-06-30 MED ORDER — METHYLERGONOVINE MALEATE 0.2 MG PO TABS: 0.2000 mg | ORAL_TABLET | Freq: Three times a day (TID) | ORAL | Status: AC

## 2015-06-30 MED ORDER — MORPHINE SULFATE (PF) 4 MG/ML IV SOLN
4.0000 mg | Freq: Once | INTRAVENOUS | Status: AC
  Administered 2015-06-30: 4 mg via INTRAVENOUS
  Filled 2015-06-30: qty 1

## 2015-06-30 MED ORDER — METHYLERGONOVINE MALEATE 0.2 MG PO TABS
0.2000 mg | ORAL_TABLET | Freq: Once | ORAL | Status: AC
  Administered 2015-06-30: 0.2 mg via ORAL
  Filled 2015-06-30 (×2): qty 1

## 2015-06-30 MED ORDER — ONDANSETRON HCL 4 MG/2ML IJ SOLN
4.0000 mg | Freq: Once | INTRAMUSCULAR | Status: AC
  Administered 2015-06-30: 4 mg via INTRAVENOUS
  Filled 2015-06-30: qty 2

## 2015-06-30 MED ORDER — IBUPROFEN 800 MG PO TABS: 800.0000 mg | ORAL_TABLET | Freq: Three times a day (TID) | ORAL | Status: DC | PRN

## 2015-06-30 NOTE — ED Provider Notes (Signed)
Patient's CT is negative patient's ultrasound shows retained products of conception Dr. Mayford Knife has written up the patient to go after discussing the patient with OB who recommends Methergine to try to expel the retained products conception at home. Discharge instructions per Dr. Farris Has, MD 06/30/15 [phone]

## 2015-06-30 NOTE — ED Provider Notes (Signed)
[facility] Emergency Department Provider Note     Time seen: ----------------------------------------- 12:41 PM on 06/30/2015 -----------------------------------------    I have reviewed the triage vital signs and the nursing notes.   HISTORY  Chief Complaint No chief complaint on file.    HPI Mary Cooper is a 34 y.o. female who presents ER for abdominal pain.Patient states she had a normal vaginal delivery about week ago. Patient reports her last 24 hours is had increasing lower abdominal pain particularly in the left lower quadrant, has never had pain like this before. She still has vaginal bleeding but has not had any discharge. She states the bleeding has been pretty mild. Nothing makes her pain better or worse.   No past medical history on file.  Patient Active Problem List   Diagnosis Date Noted  . Indication for care in labor or delivery 06/22/2015  . Vaginal delivery 06/22/2015  . 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 Heartburn during pregnancy in third trimester, antepartum 05/27/2015    Past Surgical History  Procedure Laterality Date  . Dilation and curettage of uterus  2006    Allergies Review of patient's allergies indicates no known allergies.  Social History Social History  Substance Use Topics  . Smoking status: Current Every Day Smoker  . Smokeless tobacco: Never Used  . Alcohol Use: No    Review of Systems Constitutional: Negative for fever. Eyes: Negative for visual changes. ENT: Negative for sore throat. Cardiovascular: Negative for chest pain. Respiratory: Negative for shortness of breath. Gastrointestinal: Positive for lower abdominal pain Genitourinary: Negative for dysuria. Musculoskeletal: Negative for back pain. Skin: Negative for rash. Neurological: Negative for headaches, focal weakness or numbness.  10-point ROS otherwise negative.  ____________________________________________   PHYSICAL  EXAM:  VITAL SIGNS: ED Triage Vitals  Enc Vitals Group     BP --      Pulse --      Resp --      Temp --      Temp src --      SpO2 --      Weight --      Height --      Head Cir --      Peak Flow --      Pain Score --      Pain Loc --      Pain Edu? --      Excl. in GC? --     Constitutional: Alert and oriented. Well appearing and in no distress. Eyes: Conjunctivae are normal. PERRL. Normal extraocular movements. ENT   Head: Normocephalic and atraumatic.   Nose: No congestion/rhinnorhea.   Mouth/Throat: Mucous membranes are moist.   Neck: No stridor. Cardiovascular: Normal rate, regular rhythm. Normal and symmetric distal pulses are present in all extremities. No murmurs, rubs, or gallops. Respiratory: Normal respiratory effort without tachypnea nor retractions. Breath sounds are clear and equal bilaterally. No wheezes/rales/rhonchi. Gastrointestinal: Soft, left lower quadrant tenderness, no rebound or guarding. Normal bowel sounds. Musculoskeletal: Nontender with normal range of motion in all extremities. No joint effusions.  No lower extremity tenderness nor edema. Neurologic:  Normal speech and language. No gross focal neurologic deficits are appreciated. Speech is normal. No gait instability. Skin:  Skin is warm, dry and intact. No rash noted. Psychiatric: Mood and affect are normal. Speech and behavior are normal. Patient exhibits appropriate insight and judgment. ____________________________________________  ED COURSE:  Pertinent labs & imaging results that were available during my care of the patient were reviewed  by me and considered in my medical decision making (see chart for details). Patient will need a basic labs, ultrasound to check for retained products of conception. She received IV morphine and Zofran. ____________________________________________    LABS (pertinent positives/negatives)  Labs Reviewed  COMPREHENSIVE METABOLIC PANEL -  Abnormal; Notable for the following:    Total Bilirubin 0.2 (*)    All other components within normal limits  URINALYSIS COMPLETEWITH MICROSCOPIC ([facility] ONLY) - Abnormal; Notable for the following:    Color, Urine STRAW (*)    APPearance CLEAR (*)    Hgb urine dipstick 3+ (*)    Leukocytes, UA TRACE (*)    Squamous Epithelial / LPF 0-5 (*)    All other components within normal limits  CBC WITH DIFFERENTIAL/PLATELET  HCG, QUANTITATIVE, PREGNANCY    RADIOLOGY  Transvaginal ultrasound IMPRESSION: 1. In the lower uterine segment, there is a hypoechoic but complex 3.3 by 2.1 by 2.9 cm avascular structure along the endometrial canal. Statistically this is likely to be a blood clot. About 20% of retained products of conception can be avascular on ultrasound, and accordingly this could conceivably represent retained products of conception. If the patient is stable and with no signs of infection, expected management may be reasonable ; if bleeding persists or other symptoms further favor retained products of conception, then surgical evacuation may be necessary.  IMPRESSION: 1. No acute findings. No findings to explain this patient's symptoms of left flank pain. 2. No renal or ureteral stones. Mild dilation of the right intrarenal collecting system is presumed to be related to mild extrinsic compression of the distal right ureter from the postpartum uterus. This does not correlate with the left-sided symptoms. 3. Normal expected postpartum appearance of the uterus. No pelvic masses or inflammation. No evidence of abdominal or pelvic hemorrhage or free air. No abscess. No evidence of a complication related to the patient's recent vaginal birth. ____________________________________________  FINAL ASSESSMENT AND PLAN  Abdominal pain, retained products of conception  Plan: Patient with labs and imaging as dictated above. Patient presents with lower abdominal and left flank pain that  appears likely related to retained products or retained clot. This was discussed with Dr. Valentino Saxon. Patient was started on Methergine, CT was negative for any other acute etiology. She's being discharged with 24 hours of Methergine and close follow-up with Dr. Valentino Saxon.   Mary Filbert, MD   Mary Filbert, MD 07/04/15 [phone]

## 2015-06-30 NOTE — Discharge Instructions (Signed)
Flank Pain °Flank pain refers to pain that is located on the side of the body between the upper abdomen and the back. The pain may occur over a short period of time (acute) or may be long-term or reoccurring (chronic). It may be mild or severe. Flank pain can be caused by many things. °CAUSES  °Some of the more common causes of flank pain include: °· Muscle strains.   °· Muscle spasms.   °· A disease of your spine (vertebral disk disease).   °· A lung infection (pneumonia).   °· Fluid around your lungs (pulmonary edema).   °· A kidney infection.   °· Kidney stones.   °· A very painful skin rash caused by the chickenpox virus (shingles).   °· Gallbladder disease.   °HOME CARE INSTRUCTIONS  °Home care will depend on the cause of your pain. In general, °· Rest as directed by your caregiver. °· Drink enough fluids to keep your urine clear or pale yellow. °· Only take over-the-counter or prescription medicines as directed by your caregiver. Some medicines may help relieve the pain. °· Tell your caregiver about any changes in your pain. °· Follow up with your caregiver as directed. °SEEK IMMEDIATE MEDICAL CARE IF:  °· Your pain is not controlled with medicine.   °· You have new or worsening symptoms. °· Your pain increases.   °· You have abdominal pain.   °· You have shortness of breath.   °· You have persistent nausea or vomiting.   °· You have swelling in your abdomen.   °· You feel faint or pass out.   °· You have blood in your urine. °· You have a fever or persistent symptoms for more than 2-3 days. °· You have a fever and your symptoms suddenly get worse. °MAKE SURE YOU:  °· Understand these instructions. °· Will watch your condition. °· Will get help right away if you are not doing well or get worse. °Document Released: 11/15/2005 Document Revised: 06/18/2012 Document Reviewed: 05/08/2012 °[employer]® Patient Information ©2015 [employer]. This information is not intended to replace advice given to you by your  health care provider. Make sure you discuss any questions you have with your health care provider. ° °

## 2015-06-30 NOTE — ED Notes (Signed)
Pt is 1 week post-partem with normal vaginal birth with no complications, and began having L abdominal pain suddenly today.  Pt arrived via EMS.  Pt denies any other symptoms.

## 2015-07-20 ENCOUNTER — Ambulatory Visit (INDEPENDENT_AMBULATORY_CARE_PROVIDER_SITE_OTHER): Payer: PRIVATE HEALTH INSURANCE | Admitting: Obstetrics and Gynecology

## 2015-07-20 ENCOUNTER — Encounter: Payer: Self-pay | Admitting: Obstetrics and Gynecology

## 2015-07-20 ENCOUNTER — Ambulatory Visit: Payer: No Typology Code available for payment source | Admitting: Obstetrics and Gynecology

## 2015-07-20 NOTE — Patient Instructions (Signed)
  Place postpartum visit patient instructions here.  

## 2015-07-20 NOTE — Progress Notes (Signed)
  Subjective:     Mary Cooper is a 34 y.o. female who presents for a postpartum visit. She is 6 weeks postpartum following a spontaneous vaginal delivery. I have fully reviewed the prenatal and intrapartum course. The delivery was at 40 gestational weeks. Outcome: spontaneous vaginal delivery. Anesthesia: epidural. Postpartum course has been complicated by retained clots at 1 week, none since. Baby's course has been complicated by weight loss and jaundice, but resolved after 2 weeks. Baby is feeding by breast. Bleeding brown. Bowel function is normal. Bladder function is normal. Patient is not sexually active. Contraception method is abstinence. Postpartum depression screening: negative.  The following portions of the patient's history were reviewed and updated as appropriate: allergies, current medications, past family history, past medical history, past social history, past surgical history and problem list.  Review of Systems A comprehensive review of systems was negative.   Objective:    BP 122/84 mmHg  Pulse 82  Wt 187 lb 11.2 oz (85.14 kg)  Breastfeeding? Yes  General:  alert, cooperative and appears stated age   Breasts:  inspection negative, no nipple discharge or bleeding, no masses or nodularity palpable  Lungs: clear to auscultation bilaterally  Heart:  regular rate and rhythm, S1, S2 normal, no murmur, click, rub or gallop  Abdomen: soft, non-tender; bowel sounds normal; no masses,  no organomegaly   Vulva:  normal  Vagina: normal vagina, no discharge, exudate, lesion, or erythema  Cervix:  multiparous appearance  Corpus: normal size, contour, position, consistency, mobility, non-tender  Adnexa:  normal adnexa  Rectal Exam: Not performed.        Assessment:     6 weeks postpartum exam. Pap smear not done at today's visit.   Plan:    1. Contraception: oral progesterone-only contraceptive 2. breastfeeding 3. Follow up in: 4 months for AE or as needed.

## 2015-07-21 NOTE — Telephone Encounter (Signed)
ERROR

## 2015-09-08 ENCOUNTER — Telehealth: Payer: Self-pay | Admitting: Obstetrics and Gynecology

## 2015-09-08 ENCOUNTER — Other Ambulatory Visit: Payer: Self-pay | Admitting: Obstetrics and Gynecology

## 2015-09-08 MED ORDER — ETONOGESTREL-ETHINYL ESTRADIOL 0.12-0.015 MG/24HR VA RING: VAGINAL_RING | VAGINAL | Status: DC

## 2015-09-08 NOTE — Telephone Encounter (Signed)
Done-ac 

## 2015-09-08 NOTE — Telephone Encounter (Signed)
Please let her know I sent it in today 

## 2015-09-08 NOTE — Telephone Encounter (Signed)
Mary Cooper would like to change her BC to nuva ring. [facility] [address]

## 2015-09-08 NOTE — Telephone Encounter (Signed)
pls advise

## 2015-10-09 NOTE — L&D Delivery Note (Signed)
Delivery Note At  2022 a viable and healthy female was delivered via  (Presentation:OA ;  ).  APGAR:8 ,9 ; weight  .   Placenta status: delivered intact with 3 vessel  Cord:  with the following complications: none  Anesthesia:  epidural Episiotomy:  none Lacerations:  none Suture Repair: NA Est. Blood Loss (mL):  200  Mom to postpartum.  Baby to Couplet care / Skin to Skin.  Alezandra Egli [employer], CNM 05/02/2016, 8:40 PM

## 2015-10-20 ENCOUNTER — Ambulatory Visit: Payer: PRIVATE HEALTH INSURANCE | Admitting: Obstetrics and Gynecology

## 2015-11-03 ENCOUNTER — Encounter: Payer: Self-pay | Admitting: Physician Assistant

## 2015-11-03 ENCOUNTER — Ambulatory Visit: Payer: Self-pay | Admitting: Physician Assistant

## 2015-11-03 VITALS — BP 110/65 | HR 84 | Temp 98.5°F

## 2015-11-03 DIAGNOSIS — J018 Other acute sinusitis: Secondary | ICD-10-CM

## 2015-11-03 MED ORDER — AZITHROMYCIN 250 MG PO TABS: ORAL_TABLET | ORAL | Status: DC

## 2015-11-03 MED ORDER — FLUTICASONE PROPIONATE 50 MCG/ACT NA SUSP: 2.0000 | Freq: Every day | NASAL | Status: DC

## 2015-11-03 NOTE — Progress Notes (Signed)
S: C/o sneezing and congestion for 2-3 days, no fever, chills, cp/sob, v/d; mucus is green and thick, c/o of facial and dental pain.   Using otc meds: allergy med without relief  O: PE: vitals wnl, nad, perrl eomi, normocephalic, tms dull, nasal mucosa red and swollen, max sinus tender, throat injected, neck supple no lymph, lungs c t a, cv rrr, neuro intact  A:  Acute sinusitis   P: zpack, flonase; drink fluids, continue regular meds , use otc meds of choice, return if not improving in 5 days, return earlier if worsening

## 2015-11-15 ENCOUNTER — Ambulatory Visit: Payer: Self-pay | Admitting: Physician Assistant

## 2015-11-15 ENCOUNTER — Encounter: Payer: Self-pay | Admitting: Physician Assistant

## 2015-11-15 VITALS — BP 110/70 | HR 72 | Temp 98.1°F

## 2015-11-15 DIAGNOSIS — N390 Urinary tract infection, site not specified: Secondary | ICD-10-CM

## 2015-11-15 LAB — POCT URINALYSIS DIPSTICK
Bilirubin, UA: NEGATIVE
Blood, UA: NEGATIVE
GLUCOSE UA: NEGATIVE
Ketones, UA: NEGATIVE
NITRITE UA: POSITIVE
Protein, UA: NEGATIVE
Spec Grav, UA: 1.025
UROBILINOGEN UA: 0.2
pH, UA: 6.5

## 2015-11-15 MED ORDER — NITROFURANTOIN MONOHYD MACRO 100 MG PO CAPS: 100.0000 mg | ORAL_CAPSULE | Freq: Two times a day (BID) | ORAL | Status: DC

## 2015-11-15 NOTE — Addendum Note (Signed)
Addended by: Catha Brow T on: 11/15/2015 02:51 PM   Modules accepted: Orders

## 2015-11-15 NOTE — Progress Notes (Signed)
S:  C/o uti sx for 2 days, burning, urgency, frequency, denies vaginal discharge, abdominal pain or flank pain:  Is pregnant, denies abd cramping or bleeding; Remainder ros neg  O:  Vitals wnl, nad, no cva tenderness, back nontender, lungs c t a,cv rrr, abd soft nontender, bs normal, n/v intact  A: uti  P: macrobid ;  bid x 7d, increase water intake, add cranberry juice, return if not improving in 2 -3 days, return earlier if worsening, discussed pyelonephritis sx

## 2015-11-22 ENCOUNTER — Other Ambulatory Visit: Payer: Self-pay | Admitting: Obstetrics and Gynecology

## 2015-11-22 ENCOUNTER — Encounter: Payer: Self-pay | Admitting: Obstetrics and Gynecology

## 2015-11-22 ENCOUNTER — Ambulatory Visit (INDEPENDENT_AMBULATORY_CARE_PROVIDER_SITE_OTHER): Payer: Managed Care, Other (non HMO) | Admitting: Obstetrics and Gynecology

## 2015-11-22 VITALS — BP 114/67 | HR 75 | Wt 190.8 lb

## 2015-11-22 DIAGNOSIS — N926 Irregular menstruation, unspecified: Secondary | ICD-10-CM

## 2015-11-22 LAB — POCT URINE PREGNANCY: Preg Test, Ur: POSITIVE — AB

## 2015-11-22 NOTE — Progress Notes (Signed)
NOB-pt is doing well, this was unplanned, however they are happy about the pregnancy

## 2015-11-22 NOTE — Progress Notes (Signed)
NEW OB HISTORY AND PHYSICAL  SUBJECTIVE:       Mary Cooper is a 35 y.o. [phone] female, Patient's last menstrual period was 08/07/2015., Estimated Date of Delivery: 05/13/16, [redacted]w[redacted]d, presents today for establishment of Prenatal Care. She has no unusual complaints and complains of nothing now- nausea has subsided.      Gynecologic History Patient's last menstrual period was 08/07/2015. Normal Contraception: none Last Pap: 2014. Results were: normal  Obstetric History OB History  Gravida Para Term Preterm AB SAB TAB Ectopic Multiple Living  # Outcome Date GA Lbr Len/2nd Weight Sex Delivery Anes PTL Lv  6 Current           5 Gravida 2014    M    Y  4 Gravida           3 TAB           2 SAB           1 Gravida     M Vag-Spont   Y      History reviewed. No pertinent past medical history.  Past Surgical History  Procedure Laterality Date  . Dilation and curettage of uterus  2006    Current Outpatient Prescriptions on File Prior to Visit  Medication Sig Dispense Refill  . Prenatal Vit-Fe Fumarate-FA (PRENATAL MULTIVITAMIN) TABS tablet Take 1 tablet by mouth daily at 12 noon.    . nitrofurantoin, macrocrystal-monohydrate, (MACROBID) 100 MG capsule Take 1 capsule (100 mg total) by mouth 2 (two) times daily. (Patient not taking: Reported on 11/22/2015) 14 capsule 0   No current facility-administered medications on file prior to visit.    No Known Allergies  Social History   Social History  . Marital Status: Divorced    Spouse Name: N/A  . Number of Children: N/A  . Years of Education: N/A   Occupational History  . Not on file.   Social History Main Topics  . Smoking status: Former Smoker    Types: Cigarettes  . Smokeless tobacco: Never Used  . Alcohol Use: No  . Drug Use: No  . Sexual Activity: Yes    Birth Control/ Protection: None   Other Topics Concern  . Not on file   Social History Narrative    Family History  Problem Relation Age  of Onset  . Cancer Paternal Grandmother     BREAST    The following portions of the patient's history were reviewed and updated as appropriate: allergies, current medications, past OB history, past medical history, past surgical history, past family history, past social history, and problem list.    OBJECTIVE: Initial Physical Exam (New OB)  GENERAL APPEARANCE: alert, well appearing, in no apparent distress, oriented to person, place and time HEAD: normocephalic, atraumatic MOUTH: mucous membranes moist, pharynx normal without lesions THYROID: no thyromegaly or masses present BREASTS: not examined LUNGS: clear to auscultation, no wheezes, rales or rhonchi, symmetric air entry HEART: regular rate and rhythm, no murmurs ABDOMEN: soft, nontender, nondistended, no abnormal masses, no epigastric pain, fundus soft, nontender 16 weeks size and FHT present EXTREMITIES: no redness or tenderness in the calves or thighs SKIN: normal coloration and turgor, no rashes LYMPH NODES: no adenopathy palpable NEUROLOGIC: alert, oriented, normal speech, no focal findings or movement disorder noted  PELVIC EXAM EXTERNAL GENITALIA: normal appearing vulva with no masses, tenderness or lesions CERVIX: no lesions or cervical motion tenderness UTERUS: gravid and  consistent with 16 weeks ADNEXA: no masses palpable and nontender  ASSESSMENT: Normal pregnancy Late entry to care  close spacing (infant is 50 months old) Obesity   PLAN: Prenatal care Labs and panarama obtained. See orders

## 2015-11-23 LAB — GC/CHLAMYDIA PROBE AMP
CHLAMYDIA, DNA PROBE: NEGATIVE
NEISSERIA GONORRHOEAE BY PCR: NEGATIVE

## 2015-11-23 LAB — URINALYSIS, ROUTINE W REFLEX MICROSCOPIC
BILIRUBIN UA: NEGATIVE
GLUCOSE, UA: NEGATIVE
KETONES UA: NEGATIVE
Leukocytes, UA: NEGATIVE
NITRITE UA: NEGATIVE
Protein, UA: NEGATIVE
RBC UA: NEGATIVE
SPEC GRAV UA: 1.023 (ref 1.005–1.030)
UUROB: 0.2 mg/dL (ref 0.2–1.0)
pH, UA: 7.5 (ref 5.0–7.5)

## 2015-11-23 LAB — URINE CULTURE: Organism ID, Bacteria: NO GROWTH

## 2015-11-23 LAB — CYTOLOGY - PAP

## 2015-11-24 LAB — HEP, RPR, HIV PANEL
HIV Screen 4th Generation wRfx: NONREACTIVE
Hepatitis B Surface Ag: NEGATIVE
RPR Ser Ql: NONREACTIVE

## 2015-11-24 LAB — CBC WITH DIFFERENTIAL/PLATELET
BASOS ABS: 0 10*3/uL (ref 0.0–0.2)
Basos: 0 %
EOS (ABSOLUTE): 0.2 10*3/uL (ref 0.0–0.4)
Eos: 2 %
Hematocrit: 35 % (ref 34.0–46.6)
Hemoglobin: 11.8 g/dL (ref 11.1–15.9)
IMMATURE GRANULOCYTES: 0 %
Immature Grans (Abs): 0 10*3/uL (ref 0.0–0.1)
LYMPHS ABS: 1.5 10*3/uL (ref 0.7–3.1)
Lymphs: 24 %
MCH: 27.3 pg (ref 26.6–33.0)
MCHC: 33.7 g/dL (ref 31.5–35.7)
MCV: 81 fL (ref 79–97)
MONOCYTES: 7 %
MONOS ABS: 0.4 10*3/uL (ref 0.1–0.9)
NEUTROS PCT: 67 %
Neutrophils Absolute: 4.1 10*3/uL (ref 1.4–7.0)
Platelets: 235 10*3/uL (ref 150–379)
RBC: 4.32 x10E6/uL (ref 3.77–5.28)
RDW: 15.9 % — AB (ref 12.3–15.4)
WBC: 6.1 10*3/uL (ref 3.4–10.8)

## 2015-11-24 LAB — ABO AND RH: Rh Factor: POSITIVE

## 2015-11-24 LAB — VARICELLA ZOSTER ANTIBODY, IGG: Varicella zoster IgG: 349 index (ref >165)

## 2015-11-24 LAB — ANTIBODY SCREEN: ANTIBODY SCREEN: NEGATIVE

## 2015-11-24 LAB — RUBELLA SCREEN: RUBELLA: 1.01 {index} (ref >0.99)

## 2015-11-30 ENCOUNTER — Other Ambulatory Visit: Payer: Self-pay | Admitting: Obstetrics and Gynecology

## 2015-11-30 ENCOUNTER — Encounter: Payer: Self-pay | Admitting: Obstetrics and Gynecology

## 2015-12-06 ENCOUNTER — Telehealth: Payer: Self-pay | Admitting: Obstetrics and Gynecology

## 2015-12-06 NOTE — Telephone Encounter (Signed)
Mary Cooper IS HAVING PAIN MIDDLE TO RIGHT SIDE/    PLEASE CALL HER.

## 2015-12-12 NOTE — Telephone Encounter (Signed)
Called pt several times could never get # to go through

## 2015-12-20 ENCOUNTER — Encounter: Payer: Self-pay | Admitting: Obstetrics and Gynecology

## 2015-12-20 ENCOUNTER — Ambulatory Visit (INDEPENDENT_AMBULATORY_CARE_PROVIDER_SITE_OTHER): Payer: Managed Care, Other (non HMO) | Admitting: Obstetrics and Gynecology

## 2015-12-20 ENCOUNTER — Ambulatory Visit (INDEPENDENT_AMBULATORY_CARE_PROVIDER_SITE_OTHER): Payer: Managed Care, Other (non HMO)

## 2015-12-20 VITALS — BP 109/72 | HR 73 | Wt 198.0 lb

## 2015-12-20 DIAGNOSIS — N926 Irregular menstruation, unspecified: Secondary | ICD-10-CM | POA: Diagnosis not present

## 2015-12-20 DIAGNOSIS — Z331 Pregnant state, incidental: Secondary | ICD-10-CM

## 2015-12-20 LAB — POCT URINALYSIS DIPSTICK
Bilirubin, UA: NEGATIVE
GLUCOSE UA: NEGATIVE
Ketones, UA: NEGATIVE
LEUKOCYTES UA: NEGATIVE
NITRITE UA: NEGATIVE
RBC UA: NEGATIVE
Spec Grav, UA: 1.01
UROBILINOGEN UA: 0.2
pH, UA: 7.5

## 2015-12-20 NOTE — Progress Notes (Signed)
ROB-pt had anatomy scan today, pt is feeling good

## 2015-12-20 NOTE — Progress Notes (Signed)
Indications:Anatomy U/S Findings:  Singleton intrauterine pregnancy is visualized with FHR at 149 BPM. Biometrics give an (U/S) Gestational age of 34 1/2 weeks and an (U/S) EDD of 05/15/15; this correlates with the clinically established EDD of 05/13/16.  Fetal presentation is Breech and Variable.  EFW: 278g (10oz). Placenta: posterior, grade 0, 6.4cm from internal os. AFI: Adequate with MVP of 5cm.  Ovaries are not visualized. Anatomic survey is complete and normal; Gender - female  .  Survey of the adnexa demonstrates no adnexal masses. There is no free peritoneal fluid in the cul de sac.  Impression: 1. 19 1/2 week Viable Singleton Intrauterine pregnancy by U/S. 2. (U/S) EDD is consistent with Clinically established (LMP) EDD of 05/13/16. 3. Normal Anatomy Scan

## 2016-01-02 ENCOUNTER — Ambulatory Visit: Payer: Self-pay | Admitting: Physician Assistant

## 2016-01-02 ENCOUNTER — Telehealth: Payer: Self-pay | Admitting: Obstetrics and Gynecology

## 2016-01-02 NOTE — Telephone Encounter (Signed)
Mary Cooper thinks she has a sinus infection, sinus pressure under eyes, green mucos, stopped up

## 2016-01-03 NOTE — Telephone Encounter (Signed)
Advised pt to take some sudafed, robitussin, nasal spray, if no better to let us know

## 2016-01-19 ENCOUNTER — Encounter: Payer: Self-pay | Admitting: Obstetrics and Gynecology

## 2016-01-19 ENCOUNTER — Ambulatory Visit (INDEPENDENT_AMBULATORY_CARE_PROVIDER_SITE_OTHER): Payer: Managed Care, Other (non HMO) | Admitting: Obstetrics and Gynecology

## 2016-01-19 VITALS — BP 119/74 | HR 87 | Wt 207.8 lb

## 2016-01-19 DIAGNOSIS — Z331 Pregnant state, incidental: Secondary | ICD-10-CM

## 2016-01-19 LAB — POCT URINALYSIS DIPSTICK
BILIRUBIN UA: NEGATIVE
Blood, UA: NEGATIVE
GLUCOSE UA: NEGATIVE
KETONES UA: NEGATIVE
Leukocytes, UA: NEGATIVE
Nitrite, UA: NEGATIVE
PROTEIN UA: NEGATIVE
Spec Grav, UA: 1.01
Urobilinogen, UA: 0.2
pH, UA: 7

## 2016-01-19 NOTE — Progress Notes (Signed)
ROB- doing well, to add TUMS as needed. Glucola next visit

## 2016-01-19 NOTE — Progress Notes (Signed)
ROB-pt is having some heartburn, otherwise doing well

## 2016-02-10 ENCOUNTER — Other Ambulatory Visit: Payer: Self-pay | Admitting: *Deleted

## 2016-02-10 MED ORDER — RANITIDINE HCL 150 MG PO TABS: 150.0000 mg | ORAL_TABLET | Freq: Two times a day (BID) | ORAL | Status: DC

## 2016-02-21 ENCOUNTER — Other Ambulatory Visit: Payer: Managed Care, Other (non HMO)

## 2016-02-21 ENCOUNTER — Other Ambulatory Visit: Payer: Self-pay

## 2016-02-21 ENCOUNTER — Ambulatory Visit (INDEPENDENT_AMBULATORY_CARE_PROVIDER_SITE_OTHER): Payer: Managed Care, Other (non HMO) | Admitting: Obstetrics and Gynecology

## 2016-02-21 ENCOUNTER — Encounter: Payer: Self-pay | Admitting: Obstetrics and Gynecology

## 2016-02-21 VITALS — BP 94/61 | HR 84 | Wt 214.9 lb

## 2016-02-21 DIAGNOSIS — Z369 Encounter for antenatal screening, unspecified: Secondary | ICD-10-CM

## 2016-02-21 DIAGNOSIS — Z131 Encounter for screening for diabetes mellitus: Secondary | ICD-10-CM

## 2016-02-21 DIAGNOSIS — Z23 Encounter for immunization: Secondary | ICD-10-CM

## 2016-02-21 DIAGNOSIS — Z3490 Encounter for supervision of normal pregnancy, unspecified, unspecified trimester: Secondary | ICD-10-CM

## 2016-02-21 DIAGNOSIS — Z3483 Encounter for supervision of other normal pregnancy, third trimester: Secondary | ICD-10-CM | POA: Diagnosis not present

## 2016-02-21 DIAGNOSIS — Z36 Encounter for antenatal screening of mother: Secondary | ICD-10-CM

## 2016-02-21 DIAGNOSIS — O09893 Supervision of other high risk pregnancies, third trimester: Secondary | ICD-10-CM | POA: Insufficient documentation

## 2016-02-21 DIAGNOSIS — Z3493 Encounter for supervision of normal pregnancy, unspecified, third trimester: Secondary | ICD-10-CM

## 2016-02-21 LAB — POCT URINALYSIS DIPSTICK
Bilirubin, UA: NEGATIVE
Blood, UA: NEGATIVE
Glucose, UA: NEGATIVE
Ketones, UA: NEGATIVE
Nitrite, UA: NEGATIVE
PH UA: 7.5
PROTEIN UA: NEGATIVE
SPEC GRAV UA: 1.01
UROBILINOGEN UA: NEGATIVE

## 2016-02-21 MED ORDER — TETANUS-DIPHTH-ACELL PERTUSSIS 5-2.5-18.5 LF-MCG/0.5 IM SUSP
0.5000 mL | Freq: Once | INTRAMUSCULAR | Status: AC
  Administered 2016-02-21: 0.5 mL via INTRAMUSCULAR

## 2016-02-21 NOTE — Progress Notes (Signed)
ROB: Patient doing well, no complaints.  28 week labs done today.  Discussed contraception (considering IUD vs BTL), desires to breastfeed.  Disucssed cord blood banking, signed blood consent.  Tdap given.  RTC in 2 weeks.

## 2016-02-22 LAB — HEMOGLOBIN AND HEMATOCRIT, BLOOD
HEMATOCRIT: 32.5 % — AB (ref 34.0–46.6)
Hemoglobin: 10.8 g/dL — ABNORMAL LOW (ref 11.1–15.9)

## 2016-02-22 LAB — GLUCOSE, 1 HOUR GESTATIONAL: GESTATIONAL DIABETES SCREEN: 146 mg/dL — AB (ref 65–139)

## 2016-02-23 ENCOUNTER — Other Ambulatory Visit: Payer: Self-pay | Admitting: Obstetrics and Gynecology

## 2016-02-23 DIAGNOSIS — O99810 Abnormal glucose complicating pregnancy: Secondary | ICD-10-CM

## 2016-02-27 ENCOUNTER — Telehealth: Payer: Self-pay | Admitting: *Deleted

## 2016-02-27 NOTE — Telephone Encounter (Signed)
-----   Message from Purcell NailsMelody N Shambley, [place] sent at 02/23/2016  1:50 PM EDT ----- Please let her know she failed her glucola- needs to do three hour within next week.

## 2016-02-27 NOTE — Telephone Encounter (Signed)
Notified pt of results, she is going to check with her boss and schedule appt- [facility] 03[facility]02/16

## 2016-02-29 ENCOUNTER — Other Ambulatory Visit: Payer: Managed Care, Other (non HMO)

## 2016-02-29 ENCOUNTER — Other Ambulatory Visit: Payer: Self-pay | Admitting: Obstetrics and Gynecology

## 2016-02-29 DIAGNOSIS — O99810 Abnormal glucose complicating pregnancy: Secondary | ICD-10-CM

## 2016-03-01 LAB — GESTATIONAL GLUCOSE TOLERANCE
GLUCOSE 1 HOUR GTT: 173 mg/dL (ref 65–179)
GLUCOSE 2 HOUR GTT: 130 mg/dL (ref 65–154)
GLUCOSE 3 HOUR GTT: 114 mg/dL (ref 65–139)
Glucose, Fasting: 89 mg/dL (ref 65–94)

## 2016-03-07 ENCOUNTER — Ambulatory Visit (INDEPENDENT_AMBULATORY_CARE_PROVIDER_SITE_OTHER): Payer: Managed Care, Other (non HMO) | Admitting: Obstetrics and Gynecology

## 2016-03-07 ENCOUNTER — Encounter: Payer: Self-pay | Admitting: Obstetrics and Gynecology

## 2016-03-07 ENCOUNTER — Other Ambulatory Visit: Payer: Self-pay | Admitting: Obstetrics and Gynecology

## 2016-03-07 VITALS — BP 101/68 | HR 77 | Wt 214.0 lb

## 2016-03-07 DIAGNOSIS — Z3493 Encounter for supervision of normal pregnancy, unspecified, third trimester: Secondary | ICD-10-CM

## 2016-03-07 LAB — POCT URINALYSIS DIPSTICK
Bilirubin, UA: NEGATIVE
Blood, UA: NEGATIVE
Glucose, UA: NEGATIVE
KETONES UA: NEGATIVE
NITRITE UA: NEGATIVE
PROTEIN UA: NEGATIVE
Spec Grav, UA: 1.01
Urobilinogen, UA: 0.2
pH, UA: 7

## 2016-03-07 NOTE — Patient Instructions (Signed)
Postpartum Tubal Ligation Postpartum tubal ligation (PPTL) is a procedure that closes the fallopian tubes right after childbirth or 1-2 days after childbirth. PPTL is done before the uterus returns to its normal location. The procedure is also called a mini-laparotomy. When the fallopian tubes are closed, the eggs that are released from the ovaries cannot enter the uterus, and sperm cannot reach the egg. PPTL is done so you will not be able to get pregnant or have a baby. Although this procedure may be undone (reversed), it should be considered permanent and irreversible. If you want to have future pregnancies, you should not have this procedure. LET YOUR HEALTH CARE PROVIDER KNOW ABOUT:  Any allergies you have.  All medicines you are taking, including vitamins, herbs, eye drops, creams, and over-the-counter medicines. This includes any use of steroids, either by mouth or in cream form.  Previous problems you or members of your family have had with the use of anesthetics.  Any blood disorders you have.  Previous surgeries you have had.  Any medical conditions you may have. RISKS AND COMPLICATIONS  Infection.  Bleeding.  Injury to surrounding organs.  Side effects from anesthetics.  Failure of the procedure.  Ectopic pregnancy.  Future regret about having the procedure done. BEFORE THE PROCEDURE  You may need to sign certain documents, including an informed consent form, up to 30 days before the date of your tubal ligation.  Follow instructions from your health care provider about eating and drinking restrictions. PROCEDURE  If done 1-2 days after a vaginal delivery:  You will be given one or more of the following:  A medicine that helps you relax (sedative).  A medicine that numbs the area (local anesthetic).  A medicine that makes you fall asleep (general anesthetic).  A medicine that is injected into an area of your body that numbs everything below the injection site  (regional anesthetic).  If you have been given general anesthetic, a tube will be put down your throat to help you breathe.  Your bladder may be emptied with a small tube (catheter).  A small cut (incision) will be made just above the pubic hair line.  The fallopian tubes will be located and brought up through the incision.  The fallopian tubes will be tied off or burned (cauterized), or they will be closed with a clamp, ring, or clip. In many cases, a small portion in the center of each fallopian tube will also be removed.  The incision will be closed with stitches (sutures).  A bandage (dressing) will be placed over the incision. The procedure may vary among health care providers and hospitals. If done after a cesarean delivery:  Tubal ligation will be done through the incision that was used for the cesarean delivery of your baby.  After the tubes are closed, the incision will be closed with stitches (sutures).  A bandage (dressing) will be placed over the incision. The procedure may vary among health care providers and hospitals. AFTER THE PROCEDURE  Your blood pressure, heart rate, breathing rate, and blood oxygen level will be monitored often until the medicines you were given have worn off.  You will be given pain medicine as needed.  If you had general anesthetic, you may have some mild discomfort in your throat. This is from the breathing tube that was placed in your throat while you were sleeping.  You may feel tired, and you should rest for the remainder of the day.  You may have some pain   or cramps in the abdominal area for 3-7 days.   This information is not intended to replace advice given to you by your health care provider. Make sure you discuss any questions you have with your health care provider.   Document Released: 09/24/2005 Document Revised: 02/08/2015 Document Reviewed: 01/05/2012 Elsevier Interactive Patient Education 2016 Elsevier Inc.  

## 2016-03-07 NOTE — Progress Notes (Signed)
ROB- doing well, desires BTL- discussed.

## 2016-03-07 NOTE — Progress Notes (Signed)
ROB- pt is doing well, denies any complaints 

## 2016-03-11 LAB — URINE CULTURE: Organism ID, Bacteria: NO GROWTH

## 2016-03-20 ENCOUNTER — Encounter: Payer: Self-pay | Admitting: Obstetrics and Gynecology

## 2016-03-20 ENCOUNTER — Ambulatory Visit (INDEPENDENT_AMBULATORY_CARE_PROVIDER_SITE_OTHER): Payer: Managed Care, Other (non HMO) | Admitting: Obstetrics and Gynecology

## 2016-03-20 VITALS — BP 112/83 | HR 96 | Wt 215.3 lb

## 2016-03-20 DIAGNOSIS — Z3493 Encounter for supervision of normal pregnancy, unspecified, third trimester: Secondary | ICD-10-CM

## 2016-03-20 LAB — POCT URINALYSIS DIPSTICK
BILIRUBIN UA: NEGATIVE
Blood, UA: NEGATIVE
GLUCOSE UA: NEGATIVE
Ketone: NEGATIVE
NITRITE UA: NEGATIVE
Protein, UA: NEGATIVE
Spec Grav, UA: 1.01
UROBILINOGEN UA: 0.2
pH, UA: 7.5

## 2016-03-20 NOTE — Progress Notes (Signed)
ROB- doing well. 

## 2016-03-20 NOTE — Progress Notes (Signed)
ROB- no complaints.  

## 2016-03-28 ENCOUNTER — Encounter: Payer: Managed Care, Other (non HMO) | Admitting: Obstetrics and Gynecology

## 2016-04-03 ENCOUNTER — Encounter: Payer: Managed Care, Other (non HMO) | Admitting: Obstetrics and Gynecology

## 2016-04-11 ENCOUNTER — Ambulatory Visit (INDEPENDENT_AMBULATORY_CARE_PROVIDER_SITE_OTHER): Payer: Managed Care, Other (non HMO) | Admitting: Obstetrics and Gynecology

## 2016-04-11 ENCOUNTER — Encounter: Payer: Self-pay | Admitting: Obstetrics and Gynecology

## 2016-04-11 VITALS — BP 109/63 | HR 83 | Wt 217.5 lb

## 2016-04-11 DIAGNOSIS — Z3493 Encounter for supervision of normal pregnancy, unspecified, third trimester: Secondary | ICD-10-CM

## 2016-04-11 LAB — POCT URINALYSIS DIPSTICK
Bilirubin, UA: NEGATIVE
Blood, UA: NEGATIVE
Glucose, UA: NEGATIVE
Ketones, UA: NEGATIVE
Nitrite, UA: NEGATIVE
PH UA: 6.5
PROTEIN UA: NEGATIVE
SPEC GRAV UA: 1.01
UROBILINOGEN UA: 0.2

## 2016-04-11 NOTE — Progress Notes (Signed)
ROB- pt c/o pelvic pressure, some low back pain

## 2016-04-11 NOTE — Progress Notes (Signed)
ROB doing well, cultures next visit. 

## 2016-04-17 ENCOUNTER — Encounter: Payer: Self-pay | Admitting: Obstetrics and Gynecology

## 2016-04-17 ENCOUNTER — Ambulatory Visit (INDEPENDENT_AMBULATORY_CARE_PROVIDER_SITE_OTHER): Payer: Managed Care, Other (non HMO) | Admitting: Obstetrics and Gynecology

## 2016-04-17 VITALS — BP 108/69 | HR 94 | Wt 219.3 lb

## 2016-04-17 DIAGNOSIS — Z3493 Encounter for supervision of normal pregnancy, unspecified, third trimester: Secondary | ICD-10-CM

## 2016-04-17 DIAGNOSIS — Z3685 Encounter for antenatal screening for Streptococcus B: Secondary | ICD-10-CM

## 2016-04-17 DIAGNOSIS — Z113 Encounter for screening for infections with a predominantly sexual mode of transmission: Secondary | ICD-10-CM

## 2016-04-17 DIAGNOSIS — R319 Hematuria, unspecified: Secondary | ICD-10-CM

## 2016-04-17 DIAGNOSIS — Z36 Encounter for antenatal screening of mother: Secondary | ICD-10-CM

## 2016-04-17 LAB — POCT URINALYSIS DIPSTICK
Glucose, UA: NEGATIVE
Ketones, UA: NEGATIVE
Nitrite, UA: NEGATIVE
SPEC GRAV UA: 1.01
UROBILINOGEN UA: 0.2
pH, UA: 7

## 2016-04-17 MED ORDER — ACYCLOVIR 400 MG PO TABS
400.0000 mg | ORAL_TABLET | Freq: Two times a day (BID) | ORAL | Status: DC
Start: 2016-04-17 — End: 2016-10-04

## 2016-04-17 NOTE — Addendum Note (Signed)
Addended by: Marchelle FolksMILLER, Beni Turrell G on: 04/17/2016 03:37 PM   Modules accepted: Orders

## 2016-04-17 NOTE — Progress Notes (Signed)
Rob infrequent contractions 36 wk culture done. 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

## 2016-04-18 LAB — URINE CULTURE: Organism ID, Bacteria: NO GROWTH

## 2016-04-19 LAB — GC/CHLAMYDIA PROBE AMP
Chlamydia trachomatis, NAA: NEGATIVE
NEISSERIA GONORRHOEAE BY PCR: NEGATIVE

## 2016-04-19 LAB — STREP GP B NAA: STREP GROUP B AG: NEGATIVE

## 2016-04-24 ENCOUNTER — Ambulatory Visit (INDEPENDENT_AMBULATORY_CARE_PROVIDER_SITE_OTHER): Payer: Managed Care, Other (non HMO) | Admitting: Obstetrics and Gynecology

## 2016-04-24 ENCOUNTER — Encounter: Payer: Self-pay | Admitting: Obstetrics and Gynecology

## 2016-04-24 VITALS — BP 112/75 | HR 85 | Wt 221.1 lb

## 2016-04-24 DIAGNOSIS — Z3493 Encounter for supervision of normal pregnancy, unspecified, third trimester: Secondary | ICD-10-CM

## 2016-04-24 LAB — POCT URINALYSIS DIPSTICK
Bilirubin, UA: NEGATIVE
Blood, UA: NEGATIVE
GLUCOSE UA: NEGATIVE
Ketones, UA: NEGATIVE
Nitrite, UA: NEGATIVE
PROTEIN UA: NEGATIVE
SPEC GRAV UA: 1.015
UROBILINOGEN UA: 0.2
pH, UA: 6

## 2016-04-24 NOTE — Progress Notes (Signed)
ROB- reviewed negative cultures, labor precautions discussed.

## 2016-04-24 NOTE — Progress Notes (Signed)
ROB- pt is having pelvic pressure, low back pain, some contractions

## 2016-04-25 ENCOUNTER — Telehealth: Payer: Self-pay | Admitting: *Deleted

## 2016-04-25 NOTE — Telephone Encounter (Signed)
Faxed new letter to [employer] [facility] [employer]

## 2016-04-25 NOTE — Telephone Encounter (Signed)
Notified pt letter had been faxed to her work

## 2016-05-02 ENCOUNTER — Inpatient Hospital Stay: Payer: Managed Care, Other (non HMO) | Admitting: Anesthesiology

## 2016-05-02 ENCOUNTER — Ambulatory Visit (INDEPENDENT_AMBULATORY_CARE_PROVIDER_SITE_OTHER): Payer: Self-pay | Admitting: Obstetrics and Gynecology

## 2016-05-02 ENCOUNTER — Inpatient Hospital Stay
Admission: EM | Admit: 2016-05-02 | Discharge: 2016-05-04 | DRG: 767 | Disposition: A | Discharge status: Home / Self Care | Payer: Managed Care, Other (non HMO) | Attending: Obstetrics and Gynecology | Admitting: Obstetrics and Gynecology

## 2016-05-02 VITALS — BP 113/78 | HR 75 | Wt 219.3 lb

## 2016-05-02 DIAGNOSIS — Z3493 Encounter for supervision of normal pregnancy, unspecified, third trimester: Secondary | ICD-10-CM

## 2016-05-02 DIAGNOSIS — Z3A38 38 weeks gestation of pregnancy: Secondary | ICD-10-CM | POA: Diagnosis not present

## 2016-05-02 DIAGNOSIS — Z0289 Encounter for other administrative examinations: Secondary | ICD-10-CM

## 2016-05-02 DIAGNOSIS — Z302 Encounter for sterilization: Secondary | ICD-10-CM | POA: Diagnosis not present

## 2016-05-02 HISTORY — DX: Gastro-esophageal reflux disease without esophagitis: K21.9

## 2016-05-02 LAB — CBC
HEMATOCRIT: 31.4 % — AB (ref 35.0–47.0)
HEMOGLOBIN: 10.6 g/dL — AB (ref 12.0–16.0)
MCH: 26.5 pg (ref 26.0–34.0)
MCHC: 33.8 g/dL (ref 32.0–36.0)
MCV: 78.2 fL — ABNORMAL LOW (ref 80.0–100.0)
Platelets: 176 10*3/uL (ref 150–440)
RBC: 4.02 MIL/uL (ref 3.80–5.20)
RDW: 15 % — ABNORMAL HIGH (ref 11.5–14.5)
WBC: 7.6 10*3/uL (ref 3.6–11.0)

## 2016-05-02 LAB — TYPE AND SCREEN
ABO/RH(D): O POS
Antibody Screen: NEGATIVE

## 2016-05-02 MED ORDER — OXYTOCIN 10 UNIT/ML IJ SOLN
INTRAMUSCULAR | Status: AC
  Filled 2016-05-02: qty 2

## 2016-05-02 MED ORDER — FENTANYL 2.5 MCG/ML W/ROPIVACAINE 0.2% IN NS 100 ML EPIDURAL INFUSION (ARMC-ANES): 10.0000 mL/h | EPIDURAL | Status: DC

## 2016-05-02 MED ORDER — LACTATED RINGERS IV SOLN: 500.0000 mL | INTRAVENOUS | Status: DC | PRN

## 2016-05-02 MED ORDER — KETOROLAC TROMETHAMINE 30 MG/ML IJ SOLN: 30.0000 mg | Freq: Four times a day (QID) | INTRAMUSCULAR | Status: DC | PRN

## 2016-05-02 MED ORDER — NALBUPHINE HCL 10 MG/ML IJ SOLN: 5.0000 mg | INTRAMUSCULAR | Status: DC | PRN

## 2016-05-02 MED ORDER — FENTANYL 2.5 MCG/ML W/ROPIVACAINE 0.2% IN NS 100 ML EPIDURAL INFUSION (ARMC-ANES)
EPIDURAL | Status: AC
  Filled 2016-05-02: qty 100

## 2016-05-02 MED ORDER — OXYTOCIN 40 UNITS IN LACTATED RINGERS INFUSION - SIMPLE MED
2.5000 [IU]/h | INTRAVENOUS | Status: DC
  Administered 2016-05-02: 39.96 [IU]/h via INTRAVENOUS
  Administered 2016-05-02: 2.5 [IU]/h via INTRAVENOUS

## 2016-05-02 MED ORDER — SENNOSIDES-DOCUSATE SODIUM 8.6-50 MG PO TABS: 2.0000 | ORAL_TABLET | ORAL | Status: DC

## 2016-05-02 MED ORDER — OXYCODONE HCL 5 MG PO TABS
ORAL_TABLET | ORAL | Status: AC
  Administered 2016-05-02: 5 mg via ORAL
  Filled 2016-05-02: qty 1

## 2016-05-02 MED ORDER — NALOXONE HCL 0.4 MG/ML IJ SOLN: 0.4000 mg | INTRAMUSCULAR | Status: DC | PRN

## 2016-05-02 MED ORDER — PRENATAL MULTIVITAMIN CH
1.0000 | ORAL_TABLET | Freq: Every day | ORAL | Status: DC
  Administered 2016-05-04: 1 via ORAL
  Filled 2016-05-02: qty 1

## 2016-05-02 MED ORDER — OXYTOCIN BOLUS FROM INFUSION: 500.0000 mL | Freq: Once | INTRAVENOUS | Status: DC

## 2016-05-02 MED ORDER — IBUPROFEN 600 MG PO TABS
ORAL_TABLET | ORAL | Status: AC
  Administered 2016-05-02: 600 mg via ORAL
  Filled 2016-05-02: qty 1

## 2016-05-02 MED ORDER — ONDANSETRON HCL 4 MG PO TABS: 4.0000 mg | ORAL_TABLET | ORAL | Status: DC | PRN

## 2016-05-02 MED ORDER — OXYTOCIN 40 UNITS IN LACTATED RINGERS INFUSION - SIMPLE MED
1.0000 m[IU]/min | INTRAVENOUS | Status: DC
  Administered 2016-05-02: 1 m[IU]/min via INTRAVENOUS
  Filled 2016-05-02: qty 1000

## 2016-05-02 MED ORDER — BENZOCAINE-MENTHOL 20-0.5 % EX AERO: 1.0000 "application " | INHALATION_SPRAY | CUTANEOUS | Status: DC | PRN

## 2016-05-02 MED ORDER — LIDOCAINE HCL (PF) 1 % IJ SOLN
INTRAMUSCULAR | Status: AC
  Filled 2016-05-02: qty 30

## 2016-05-02 MED ORDER — ONDANSETRON HCL 4 MG/2ML IJ SOLN: 4.0000 mg | Freq: Three times a day (TID) | INTRAMUSCULAR | Status: DC | PRN

## 2016-05-02 MED ORDER — MEPERIDINE HCL 25 MG/ML IJ SOLN: 6.2500 mg | INTRAMUSCULAR | Status: DC | PRN

## 2016-05-02 MED ORDER — AMMONIA AROMATIC IN INHA
RESPIRATORY_TRACT | Status: AC
  Filled 2016-05-02: qty 10

## 2016-05-02 MED ORDER — DIPHENHYDRAMINE HCL 25 MG PO CAPS: 25.0000 mg | ORAL_CAPSULE | ORAL | Status: DC | PRN

## 2016-05-02 MED ORDER — OXYCODONE HCL 5 MG PO TABS
10.0000 mg | ORAL_TABLET | ORAL | Status: DC | PRN
  Administered 2016-05-03 – 2016-05-04 (×4): 10 mg via ORAL
  Filled 2016-05-02 (×3): qty 2

## 2016-05-02 MED ORDER — WITCH HAZEL-GLYCERIN EX PADS: 1.0000 "application " | MEDICATED_PAD | CUTANEOUS | Status: DC | PRN

## 2016-05-02 MED ORDER — LACTATED RINGERS IV SOLN
INTRAVENOUS | Status: DC
Start: 2016-05-02 — End: 2016-05-02
  Administered 2016-05-02 (×2): via INTRAVENOUS

## 2016-05-02 MED ORDER — NALOXONE HCL 2 MG/2ML IJ SOSY: 1.0000 ug/kg/h | PREFILLED_SYRINGE | INTRAVENOUS | Status: DC | PRN

## 2016-05-02 MED ORDER — ACETAMINOPHEN 325 MG PO TABS: 650.0000 mg | ORAL_TABLET | ORAL | Status: DC | PRN

## 2016-05-02 MED ORDER — NALBUPHINE HCL 10 MG/ML IJ SOLN: 5.0000 mg | Freq: Once | INTRAMUSCULAR | Status: DC | PRN

## 2016-05-02 MED ORDER — DOCUSATE SODIUM 100 MG PO CAPS
100.0000 mg | ORAL_CAPSULE | Freq: Two times a day (BID) | ORAL | Status: DC
  Administered 2016-05-03 – 2016-05-04 (×3): 100 mg via ORAL
  Filled 2016-05-02 (×3): qty 1

## 2016-05-02 MED ORDER — FENTANYL 2.5 MCG/ML W/ROPIVACAINE 0.2% IN NS 100 ML EPIDURAL INFUSION (ARMC-ANES)
EPIDURAL | Status: DC | PRN
  Administered 2016-05-02: 10 mL/h via EPIDURAL

## 2016-05-02 MED ORDER — SIMETHICONE 80 MG PO CHEW: 80.0000 mg | CHEWABLE_TABLET | ORAL | Status: DC | PRN

## 2016-05-02 MED ORDER — LIDOCAINE HCL (PF) 1 % IJ SOLN: 30.0000 mL | INTRAMUSCULAR | Status: DC | PRN

## 2016-05-02 MED ORDER — ZOLPIDEM TARTRATE 5 MG PO TABS
5.0000 mg | ORAL_TABLET | Freq: Every evening | ORAL | Status: DC | PRN
Start: 2016-05-02 — End: 2016-05-04

## 2016-05-02 MED ORDER — OXYCODONE HCL 5 MG PO TABS
5.0000 mg | ORAL_TABLET | ORAL | Status: DC | PRN
  Administered 2016-05-02 – 2016-05-03 (×2): 5 mg via ORAL
  Filled 2016-05-02 (×3): qty 1

## 2016-05-02 MED ORDER — LIDOCAINE-EPINEPHRINE (PF) 1.5 %-1:200000 IJ SOLN
INTRAMUSCULAR | Status: DC | PRN
  Administered 2016-05-02: 3 mL via EPIDURAL

## 2016-05-02 MED ORDER — DIBUCAINE 1 % RE OINT: 1.0000 "application " | TOPICAL_OINTMENT | RECTAL | Status: DC | PRN

## 2016-05-02 MED ORDER — ONDANSETRON HCL 4 MG/2ML IJ SOLN: 4.0000 mg | INTRAMUSCULAR | Status: DC | PRN

## 2016-05-02 MED ORDER — COCONUT OIL OIL: 1.0000 "application " | TOPICAL_OIL | Status: DC | PRN

## 2016-05-02 MED ORDER — SODIUM CHLORIDE 0.9% FLUSH: 3.0000 mL | INTRAVENOUS | Status: DC | PRN

## 2016-05-02 MED ORDER — CEFAZOLIN SODIUM-DEXTROSE 2-4 GM/100ML-% IV SOLN
2.0000 g | INTRAVENOUS | Status: AC
  Administered 2016-05-03: 2 g via INTRAVENOUS
  Filled 2016-05-02: qty 100

## 2016-05-02 MED ORDER — SOD CITRATE-CITRIC ACID 500-334 MG/5ML PO SOLN: 30.0000 mL | ORAL | Status: DC | PRN

## 2016-05-02 MED ORDER — DIPHENHYDRAMINE HCL 50 MG/ML IJ SOLN: 12.5000 mg | INTRAMUSCULAR | Status: DC | PRN

## 2016-05-02 MED ORDER — ACETAMINOPHEN 325 MG PO TABS
650.0000 mg | ORAL_TABLET | ORAL | Status: DC | PRN
  Filled 2016-05-02: qty 2

## 2016-05-02 MED ORDER — TERBUTALINE SULFATE 1 MG/ML IJ SOLN: 0.2500 mg | Freq: Once | INTRAMUSCULAR | Status: DC | PRN

## 2016-05-02 MED ORDER — ONDANSETRON HCL 4 MG/2ML IJ SOLN
4.0000 mg | Freq: Four times a day (QID) | INTRAMUSCULAR | Status: DC | PRN
  Administered 2016-05-02: 4 mg via INTRAVENOUS
  Filled 2016-05-02: qty 2

## 2016-05-02 MED ORDER — DIPHENHYDRAMINE HCL 25 MG PO CAPS: 25.0000 mg | ORAL_CAPSULE | Freq: Four times a day (QID) | ORAL | Status: DC | PRN

## 2016-05-02 MED ORDER — MISOPROSTOL 200 MCG PO TABS
ORAL_TABLET | ORAL | Status: AC
  Filled 2016-05-02: qty 4

## 2016-05-02 MED ORDER — IBUPROFEN 600 MG PO TABS
600.0000 mg | ORAL_TABLET | Freq: Four times a day (QID) | ORAL | Status: DC
  Administered 2016-05-02: 600 mg via ORAL

## 2016-05-02 MED ORDER — LIDOCAINE HCL (PF) 1 % IJ SOLN
INTRAMUSCULAR | Status: DC | PRN
  Administered 2016-05-02: 3 mL via SUBCUTANEOUS

## 2016-05-02 MED ORDER — SODIUM CHLORIDE 0.9 % IV SOLN
INTRAVENOUS | Status: DC | PRN
  Administered 2016-05-02: 5 mL via EPIDURAL
  Administered 2016-05-02: 3 mL via EPIDURAL

## 2016-05-02 MED ORDER — FENTANYL CITRATE (PF) 100 MCG/2ML IJ SOLN: 50.0000 ug | INTRAMUSCULAR | Status: DC | PRN

## 2016-05-02 MED ORDER — ACYCLOVIR 200 MG PO CAPS
400.0000 mg | ORAL_CAPSULE | Freq: Two times a day (BID) | ORAL | Status: DC
  Administered 2016-05-03 – 2016-05-04 (×3): 400 mg via ORAL
  Filled 2016-05-02 (×4): qty 2

## 2016-05-02 MED ORDER — SOD CITRATE-CITRIC ACID 500-334 MG/5ML PO SOLN: 30.0000 mL | ORAL | Status: DC

## 2016-05-02 NOTE — Plan of Care (Signed)
M.shambley,cnm notified of delay in starting pitocin.  States to start when adequate staffing. Informed pt of plan and pt ok with that.

## 2016-05-02 NOTE — Anesthesia Preprocedure Evaluation (Signed)
Anesthesia Evaluation  Patient identified by MRN, date of birth, ID band Patient awake    Reviewed: Allergy & Precautions, H&P , NPO status , Patient's Chart, lab work & pertinent test results, reviewed documented beta blocker date and time   History of Anesthesia Complications Negative for: history of anesthetic complications  Airway Mallampati: II  TM Distance: >3 FB Neck ROM: full    Dental no notable dental hx. (+) Teeth Intact, Caps   Pulmonary neg pulmonary ROS, former smoker,    Pulmonary exam normal breath sounds clear to auscultation       Cardiovascular Exercise Tolerance: Good negative cardio ROS Normal cardiovascular exam Rhythm:regular Rate:Normal     Neuro/Psych negative neurological ROS  negative psych ROS   GI/Hepatic Neg liver ROS, GERD  ,  Endo/Other  negative endocrine ROS  Renal/GU negative Renal ROS  negative genitourinary   Musculoskeletal   Abdominal   Peds  Hematology negative hematology ROS (+)   Anesthesia Other Findings History reviewed. No pertinent past medical history.   Reproductive/Obstetrics (+) Pregnancy                             Anesthesia Physical Anesthesia Plan  ASA: II  Anesthesia Plan: Epidural   Post-op Pain Management:    Induction:   Airway Management Planned:   Additional Equipment:   Intra-op Plan:   Post-operative Plan:   Informed Consent: I have reviewed the patients History and Physical, chart, labs and discussed the procedure including the risks, benefits and alternatives for the proposed anesthesia with the patient or authorized representative who has indicated his/her understanding and acceptance.   Dental Advisory Given  Plan Discussed with: Anesthesiologist, CRNA and Surgeon  Anesthesia Plan Comments:         Anesthesia Quick Evaluation

## 2016-05-02 NOTE — Progress Notes (Signed)
ROB- pt is having some pelvic pressure, contractions 

## 2016-05-02 NOTE — Progress Notes (Signed)
ROB- reports irregular but painful contractions since yesterday. Sent to L&D for delivery.

## 2016-05-02 NOTE — Progress Notes (Signed)
Mary Cooper is a 35 y.o. [phone] at [redacted]w[redacted]d by LMP admitted for active labor  Subjective: Denies pain with contractions as epidural is in place  Objective: BP 120/77   Pulse 77   Temp 98.2 F (36.8 C) (Oral)   Resp 18   Ht 5\' 5"  (1.651 m)   Wt 219 lb (99.3 kg)   LMP 08/07/2015   SpO2 99%   BMI 36.44 kg/m  No intake/output data recorded. No intake/output data recorded.  FHT:  FHR: 150 bpm, variability: moderate,  accelerations:  Present,  decelerations:  Absent UC:   irregular, every 3-4 minutes on 47mu/min pitocin SVE:   Dilation: 7.5 Effacement (%): 80 Station: 0 Exam by:: s.greene  Labs: Lab Results  Component Value Date   WBC 7.6 05/02/2016   HGB 10.6 (L) 05/02/2016   HCT 31.4 (L) 05/02/2016   MCV 78.2 (L) 05/02/2016   PLT 176 05/02/2016    Assessment / Plan: IOL at term due advanced cervical dilation  Labor: Progressing on Pitocin, will continue to increase then AROM Preeclampsia:  labs stable Fetal Wellbeing:  Category I Pain Control:  Epidural I/D:  n/a Anticipated MOD:  NSVD  Melody N Aura Camps, CNM 05/02/2016, 7:16 PM

## 2016-05-02 NOTE — H&P (Signed)
Mary Cooper is a 35 y.o. female presenting for labor with advanced cervical dilation and irregular contractions since yesterday. OB History    Gravida Para Term Preterm AB Living   6 3 3   2 3    SAB TAB Ectopic Multiple Live Births   1 1     3      No past medical history on file. Past Surgical History:  Procedure Laterality Date  . DILATION AND CURETTAGE OF UTERUS  2006   Family History: family history includes Cancer in her paternal grandmother. Social History:  reports that she has quit smoking. Her smoking use included Cigarettes. She has never used smokeless tobacco. She reports that she does not drink alcohol or use drugs.     Maternal Diabetes: No Genetic Screening: Normal Maternal Ultrasounds/Referrals: Normal Fetal Ultrasounds or other Referrals:  None Maternal Substance Abuse:  No Significant Maternal Medications:  Meds include: Other: acyclovir Significant Maternal Lab Results:  None Other Comments:  None  ROS History Dilation: 6.5 Effacement (%): 80 Station: -1 Blood pressure 113/78, pulse 75, weight 219 lb 4.8 oz (99.5 kg), last menstrual period 08/07/2015, currently breastfeeding. Exam Physical Exam  A&O x4  HRR, lungs clear bilaterally Abdomen gravid, EFW 8# or greater, moderate contractions palpated FHR 160s Mild pedal edema No HSV lesions noted on exam Prenatal labs: ABO, Rh: O/Positive/-- (02/14 1156) Antibody: Negative (02/14 1156) Rubella: 1.01 (02/14 1156) RPR: Non Reactive (02/14 1156)  HBsAg: Negative (02/14 1156)  HIV: Non Reactive (02/14 1156)  GBS: Negative (07/11 1621)   Assessment/Plan: Advanced cervical dialtion at [redacted]w[redacted]d admitted for labor, will add pitocin if needed. May have epidural when desires. Plans PPTL- will make Dr CMarcelline Mates[place].   Shawnise Peterkin [employer] CNM 05/02/2016, 8:40 AM

## 2016-05-02 NOTE — H&P (Deleted)
  The note originally documented on this encounter has been moved the the encounter in which it belongs.  

## 2016-05-02 NOTE — Progress Notes (Signed)
Mary Cooper is a 35 y.o. [id number] at [redacted]w[redacted]d by LMP admitted for induction of labor due to advanced cervical dilation.  Subjective:   Objective: Ht 5\' 5"  (1.651 m)   LMP 08/07/2015   BMI 36.49 kg/m  No intake/output data recorded. No intake/output data recorded.  FHT:  FHR: 144 bpm, variability: moderate,  accelerations:  Present,  decelerations:  Absent UC:   irregular, every 2-4 minutes, moderate to palpation, on 2 mu/min pitocin SVE:   Dilation: 5 Effacement (%): 90 Station: 0 Exam by:: s.greene  Labs: Lab Results  Component Value Date   WBC 7.6 05/02/2016   HGB 10.6 (L) 05/02/2016   HCT 31.4 (L) 05/02/2016   MCV 78.2 (L) 05/02/2016   PLT 176 05/02/2016    Assessment / Plan: Induction of labor due to term with favorable cervix,  progressing well on pitocinawaiting epidural placement  Labor: Progressing on Pitocin, will continue to increase then AROM Preeclampsia:  labs stable Fetal Wellbeing:  Category I Pain Control:  Labor support without medications I/D:  n/a Anticipated MOD:  NSVD  Maira Christon [employer], CNM 05/02/2016, 5:50 PM

## 2016-05-03 ENCOUNTER — Encounter
Admission: EM | Disposition: A | Discharge status: Home / Self Care | Payer: Self-pay | Source: Home / Self Care | Attending: Obstetrics and Gynecology

## 2016-05-03 ENCOUNTER — Inpatient Hospital Stay: Payer: Managed Care, Other (non HMO) | Admitting: Anesthesiology

## 2016-05-03 ENCOUNTER — Encounter: Payer: Self-pay | Admitting: Anesthesiology

## 2016-05-03 DIAGNOSIS — Z302 Encounter for sterilization: Secondary | ICD-10-CM

## 2016-05-03 HISTORY — PX: TUBAL LIGATION: SHX77

## 2016-05-03 LAB — CBC
HEMATOCRIT: 30.6 % — AB (ref 35.0–47.0)
Hemoglobin: 10.2 g/dL — ABNORMAL LOW (ref 12.0–16.0)
MCH: 26.3 pg (ref 26.0–34.0)
MCHC: 33.2 g/dL (ref 32.0–36.0)
MCV: 79.3 fL — AB (ref 80.0–100.0)
PLATELETS: 157 10*3/uL (ref 150–440)
RBC: 3.86 MIL/uL (ref 3.80–5.20)
RDW: 15 % — ABNORMAL HIGH (ref 11.5–14.5)
WBC: 8.7 10*3/uL (ref 3.6–11.0)

## 2016-05-03 LAB — RPR: RPR Ser Ql: NONREACTIVE

## 2016-05-03 SURGERY — LIGATION, FALLOPIAN TUBE, POSTPARTUM
Anesthesia: General | Wound class: Clean

## 2016-05-03 MED ORDER — SUGAMMADEX SODIUM 200 MG/2ML IV SOLN
INTRAVENOUS | Status: DC | PRN
  Administered 2016-05-03: 200 mg via INTRAVENOUS

## 2016-05-03 MED ORDER — ONDANSETRON HCL 4 MG/2ML IJ SOLN: 4.0000 mg | Freq: Once | INTRAMUSCULAR | Status: DC | PRN

## 2016-05-03 MED ORDER — ONDANSETRON HCL 4 MG/2ML IJ SOLN
INTRAMUSCULAR | Status: DC | PRN
  Administered 2016-05-03: 4 mg via INTRAVENOUS

## 2016-05-03 MED ORDER — KETOROLAC TROMETHAMINE 30 MG/ML IJ SOLN
INTRAMUSCULAR | Status: DC | PRN
  Administered 2016-05-03: 30 mg via INTRAVENOUS

## 2016-05-03 MED ORDER — ROCURONIUM BROMIDE 100 MG/10ML IV SOLN
INTRAVENOUS | Status: DC | PRN
  Administered 2016-05-03: 15 mg via INTRAVENOUS

## 2016-05-03 MED ORDER — FENTANYL CITRATE (PF) 100 MCG/2ML IJ SOLN
25.0000 ug | INTRAMUSCULAR | Status: DC | PRN
  Administered 2016-05-03 (×4): 25 ug via INTRAVENOUS

## 2016-05-03 MED ORDER — LIDOCAINE HCL (CARDIAC) 20 MG/ML IV SOLN
INTRAVENOUS | Status: DC | PRN
  Administered 2016-05-03: 50 mg via INTRAVENOUS

## 2016-05-03 MED ORDER — DEXAMETHASONE SODIUM PHOSPHATE 10 MG/ML IJ SOLN
INTRAMUSCULAR | Status: DC | PRN
  Administered 2016-05-03: 5 mg via INTRAVENOUS

## 2016-05-03 MED ORDER — BUPIVACAINE HCL 0.5 % IJ SOLN
INTRAMUSCULAR | Status: DC | PRN
  Administered 2016-05-03: 14 mL

## 2016-05-03 MED ORDER — FENTANYL CITRATE (PF) 100 MCG/2ML IJ SOLN
INTRAMUSCULAR | Status: DC | PRN
  Administered 2016-05-03 (×2): 50 ug via INTRAVENOUS

## 2016-05-03 MED ORDER — MIDAZOLAM HCL 2 MG/2ML IJ SOLN
INTRAMUSCULAR | Status: DC | PRN
  Administered 2016-05-03: 1 mg via INTRAVENOUS

## 2016-05-03 MED ORDER — FENTANYL CITRATE (PF) 100 MCG/2ML IJ SOLN
INTRAMUSCULAR | Status: AC
  Administered 2016-05-03: 25 ug via INTRAVENOUS
  Filled 2016-05-03: qty 2

## 2016-05-03 MED ORDER — PROPOFOL 10 MG/ML IV BOLUS
INTRAVENOUS | Status: DC | PRN
  Administered 2016-05-03: 150 mg via INTRAVENOUS
  Administered 2016-05-03: 40 mg via INTRAVENOUS

## 2016-05-03 MED ORDER — SODIUM CHLORIDE 0.9 % IV SOLN
INTRAVENOUS | Status: DC
  Administered 2016-05-03: 100 mL/h via INTRAVENOUS

## 2016-05-03 MED ORDER — SUCCINYLCHOLINE CHLORIDE 20 MG/ML IJ SOLN
INTRAMUSCULAR | Status: DC | PRN
  Administered 2016-05-03: 100 mg via INTRAVENOUS

## 2016-05-03 MED ORDER — IBUPROFEN 600 MG PO TABS
600.0000 mg | ORAL_TABLET | Freq: Four times a day (QID) | ORAL | Status: DC
  Administered 2016-05-03 – 2016-05-04 (×5): 600 mg via ORAL
  Filled 2016-05-03 (×5): qty 1

## 2016-05-03 SURGICAL SUPPLY — 29 items
BLADE SURG SZ11 CARB STEEL (BLADE) ×3 IMPLANT
CHLORAPREP W/TINT 26ML (MISCELLANEOUS) ×3 IMPLANT
CLOSURE WOUND 1/2 X4 (GAUZE/BANDAGES/DRESSINGS) ×1
DRAPE LAPAROTOMY 100X77 ABD (DRAPES) ×3 IMPLANT
DRSG TEGADERM 2-3/8X2-3/4 SM (GAUZE/BANDAGES/DRESSINGS) ×3 IMPLANT
GAUZE SPONGE NON-WVN 2X2 STRL (MISCELLANEOUS) ×1 IMPLANT
GLOVE BIO SURGEON STRL SZ 6.5 (GLOVE) ×2 IMPLANT
GLOVE BIO SURGEONS STRL SZ 6.5 (GLOVE) ×1
GLOVE INDICATOR 7.0 STRL GRN (GLOVE) ×3 IMPLANT
GOWN STRL REUS W/ TWL LRG LVL3 (GOWN DISPOSABLE) ×2 IMPLANT
GOWN STRL REUS W/TWL LRG LVL3 (GOWN DISPOSABLE) ×4
KIT RM TURNOVER CYSTO AR (KITS) ×3 IMPLANT
LABEL OR SOLS (LABEL) ×3 IMPLANT
LIQUID BAND (GAUZE/BANDAGES/DRESSINGS) ×3 IMPLANT
NEEDLE HYPO 25GX1X1/2 BEV (NEEDLE) ×3 IMPLANT
NS IRRIG 500ML POUR BTL (IV SOLUTION) ×3 IMPLANT
PACK BASIN MINOR ARMC (MISCELLANEOUS) ×3 IMPLANT
SPONGE VERSALON 2X2 STRL (MISCELLANEOUS) ×2
STRIP CLOSURE SKIN 1/2X4 (GAUZE/BANDAGES/DRESSINGS) ×2 IMPLANT
SUT MNCRL 4-0 (SUTURE) ×2
SUT MNCRL 4-0 27XMFL (SUTURE) ×1
SUT PLAIN GUT 0 (SUTURE) ×3 IMPLANT
SUT VIC AB 0 CT1 36 (SUTURE) IMPLANT
SUT VIC AB 0 SH 27 (SUTURE) IMPLANT
SUT VIC AB 3-0 SH 27 (SUTURE) ×2
SUT VIC AB 3-0 SH 27X BRD (SUTURE) ×1 IMPLANT
SUT VICRYL 0 AB UR-6 (SUTURE) ×6 IMPLANT
SUTURE MNCRL 4-0 27XMF (SUTURE) ×1 IMPLANT
SYRINGE 10CC LL (SYRINGE) ×3 IMPLANT

## 2016-05-03 NOTE — Anesthesia Postprocedure Evaluation (Signed)
Anesthesia Post Note  Patient: Mary Cooper  Procedure(s) Performed: * No procedures listed *  Patient location during evaluation: Mother Baby Anesthesia Type: Epidural Level of consciousness: awake and alert and oriented Pain management: satisfactory to patient Vital Signs Assessment: post-procedure vital signs reviewed and stable Respiratory status: respiratory function stable Cardiovascular status: blood pressure returned to baseline Postop Assessment: no headache, no backache, patient able to bend at knees, no signs of nausea or vomiting, adequate PO intake and epidural receding Anesthetic complications: no    Last Vitals:  Vitals:   05/02/16 2328 05/03/16 0356  BP: 110/64 (!) 108/54  Pulse: 88 78  Resp: 20 18  Temp: 36.6 C 36.6 C    Last Pain:  Vitals:   05/03/16 0356  TempSrc: Oral  PainSc:                  Clydene Pugh

## 2016-05-03 NOTE — Anesthesia Procedure Notes (Signed)
Procedure Name: Intubation Date/Time: 05/03/2016 1:26 PM Performed by: Ginger Carne Pre-anesthesia Checklist: Patient identified, Emergency Drugs available, Suction available, Patient being monitored and Timeout performed Patient Re-evaluated:Patient Re-evaluated prior to inductionOxygen Delivery Method: Circle system utilized Preoxygenation: Pre-oxygenation with 100% oxygen Intubation Type: IV induction Ventilation: Mask ventilation without difficulty Laryngoscope Size: Miller and 2 Grade View: Grade I Tube type: Oral Number of attempts: 1 Airway Equipment and Method: Stylet Placement Confirmation: ETT inserted through vocal cords under direct vision,  breath sounds checked- equal and bilateral and positive ETCO2 Secured at: 20 cm Tube secured with: Tape Dental Injury: Teeth and Oropharynx as per pre-operative assessment

## 2016-05-03 NOTE — Progress Notes (Signed)
Pt arrived from PACU after tubal ligation.  Report given by PACU  Nurse.  PT arrives with new laparoscopic umbilical incision.

## 2016-05-03 NOTE — Anesthesia Preprocedure Evaluation (Signed)
Anesthesia Evaluation  Patient identified by MRN, date of birth, ID band Patient awake    Reviewed: Allergy & Precautions, NPO status , Patient's Chart, lab work & pertinent test results, reviewed documented beta blocker date and time   Airway Mallampati: III  TM Distance: >3 FB     Dental  (+) Chipped   Pulmonary former smoker,           Cardiovascular      Neuro/Psych    GI/Hepatic   Endo/Other    Renal/GU      Musculoskeletal   Abdominal   Peds  Hematology   Anesthesia Other Findings   Reproductive/Obstetrics                             Anesthesia Physical Anesthesia Plan  ASA: II  Anesthesia Plan: General   Post-op Pain Management:    Induction: Intravenous  Airway Management Planned: Oral ETT  Additional Equipment:   Intra-op Plan:   Post-operative Plan:   Informed Consent: I have reviewed the patients History and Physical, chart, labs and discussed the procedure including the risks, benefits and alternatives for the proposed anesthesia with the patient or authorized representative who has indicated his/her understanding and acceptance.     Plan Discussed with: CRNA  Anesthesia Plan Comments:         Anesthesia Quick Evaluation

## 2016-05-03 NOTE — Anesthesia Post-op Follow-up Note (Cosign Needed)
  Anesthesia Pain Follow-up Note  Patient: Mary Cooper  Day #: 1  Date of Follow-up: 05/03/2016 Time: 8:14 AM  Last Vitals:  Vitals:   05/02/16 2328 05/03/16 0356  BP: 110/64 (!) 108/54  Pulse: 88 78  Resp: 20 18  Temp: 36.6 C 36.6 C    Level of Consciousness: alert  Pain: mild   Side Effects:None  Catheter Site Exam: site not evaluated     Plan: D/C from anesthesia care  Clydene Pugh

## 2016-05-03 NOTE — Anesthesia Postprocedure Evaluation (Signed)
Anesthesia Post Note  Patient: Mary Cooper  Procedure(s) Performed: Procedure(s) (LRB): POST PARTUM TUBAL LIGATION (N/A)  Patient location during evaluation: PACU Anesthesia Type: General Level of consciousness: awake and alert Pain management: pain level controlled Vital Signs Assessment: post-procedure vital signs reviewed and stable Respiratory status: spontaneous breathing, nonlabored ventilation, respiratory function stable and patient connected to nasal cannula oxygen Cardiovascular status: blood pressure returned to baseline and stable Postop Assessment: no signs of nausea or vomiting Anesthetic complications: no    Last Vitals:  Vitals:   05/03/16 1823 05/03/16 1942  BP: 127/65 110/61  Pulse: 80 89  Resp: 18 16  Temp: 36.9 C 36.6 C    Last Pain:  Vitals:   05/03/16 1942  TempSrc: Oral  PainSc:                  Rafiel Mecca S

## 2016-05-03 NOTE — Progress Notes (Signed)
OBSTETRICS AND GYNECOLOGY PRE-OPERATIVE NOTE   Pre-Op Diagnosis: PPD#1 s/p SVD. Multiparity, desiring permanent sterilization  Planned Procedure: Postpartum tubal ligation  Surgeons: Hildred Laser, MD  Anesthesia: General  Blood: Type and screened  Labs:  CBC Latest Ref Rng & Units 05/03/2016 05/02/2016 02/21/2016  WBC 3.6 - 11.0 K/uL 8.7 7.6 -  Hemoglobin 12.0 - 16.0 g/dL 10.2(L) 10.6(L) -  Hematocrit 35.0 - 47.0 % 30.6(L) 31.4(L) 32.5(L)  Platelets 150 - 440 K/uL 157 176 -    Lab Results  Component Value Date   ABORH O POS 05/02/2016    Antibiotics: None Needed   Consent: Signed and on chart   Pre-Op BTL Consent Consent: Surgical consent and tubal sterilization. Alternatives to sterilizations are documented on the surgical consent that has been signed by the patient.  Patient confirms that she has been counseled about permanent sterilization on mutiple occasions during her antepartum course and during this admission. She understands the alternatives to permanents include: oral contraceptive pills, depot provera, patch, ring, intrauterine device (5 year or 10 year), Implanon, condoms and cervical caps/diaphram.  She states that she desires the procedure.  Can proceed to OR when ready.    Hildred Laser, MD [facility]

## 2016-05-03 NOTE — Transfer of Care (Signed)
Immediate Anesthesia Transfer of Care Note  Patient: Mary Cooper  Procedure(s) Performed: Procedure(s): POST PARTUM TUBAL LIGATION (N/A)  Patient Location: PACU  Anesthesia Type:General  Level of Consciousness: awake  Airway & Oxygen Therapy: Patient Spontanous Breathing and Patient connected to face mask oxygen  Post-op Assessment: Report given to RN and Post -op Vital signs reviewed and stable  Post vital signs: Reviewed and stable  Last Vitals:  Vitals:   05/03/16 1223 05/03/16 1250  BP: 108/60 122/68  Pulse: 78 79  Resp: 20 17  Temp: 36.6 C 36.3 C    Last Pain:  Vitals:   05/03/16 1250  TempSrc: Tympanic  PainSc: 2       Patients Stated Pain Goal: 1 (05/03/16 1250)  Complications: No apparent anesthesia complications

## 2016-05-03 NOTE — Op Note (Signed)
Procedure(s): POST PARTUM TUBAL LIGATION Procedure Note  [name] female 35 y.o. 05/03/2016  Indications: The patient is a 34 y.o. [id number] grand multiparous female who is PPD#1 s /p SVD, desiring permanent sterilization.   Pre-operative Diagnosis: Grand multiparity, s/p vaginal delivery, desiring permanent sterlization  Post-operative Diagnosis: Same  Surgeon: Hildred Laser, MD  Assistants: Surgical Scrub Tech  Anesthesia: General endotracheal anesthesia  Findings: Uterus 1 fingerbreadth above the umbilicus. Fallopian tubes and ovaries appeared normal.   Procedure Details: The patient was seen in the Holding Room. The risks, benefits, complications, treatment options, and expected outcomes were discussed with the patient.  The patient concurred with the proposed plan, giving informed consent.  The site of surgery properly noted/marked. The patient was taken to the Operating Room, identified as Mary Cooper and the procedure verified as Procedure(s) (LRB): POST PARTUM TUBAL LIGATION (N/A).     The patient was taken to the operating room where she was placed under general anesthesia without difficulty.  She was then placed in the dorsal supine position and prepped and draped in sterile fashion.  After an adequate timeout was performed, attention was turned to the patient's abdomen where a small transverse skin incision was made under the umbilical fold. The incision was taken down to the layer of fascia using the scalpel, and fascia was incised, and extended bilaterally using Mayo scissors. The peritoneum was entered in a sharp fashion. Attention was then turned to the patient's uterus, and left fallopian tube was identified, grasped using a Babcock clamp, and followed out to the fimbriated end. The Babcock clamp was then used to grasp the tube approximately 4 cm from the cornual region.  A 3 cm segment of tube was then ligated with a free tie of 0-Chromic using the Pomeroy method and  excised.  The right fallopian tube was then ligated in a similar fashion and excised. The tubal lumens were cauterized bilaterally.  Good hemostasis was noted with bilateral fallopian tubes.   The instruments were then removed from the patient's abdomen and the fascial incision was repaired with 0-Vicryl in a running fashion, and the subcutaneous tissue layer was closed with 3-0 Vicryl.  The skin was closed with a 4-0 Monocryl subcuticular stitch. The patient tolerated the procedure well.  Instrument, sponge, and needle counts were correct times two.  The patient was then taken to the recovery room awake and in stable condition.   Estimated Blood Loss:  minimal      Drains: None.  Patient voided prior to procedure         Total IV Fluids:  800 ml of Lactated Ringer's  Specimens: Segments of left and right fallopian tubes         Implants: None         Complications:  None; patient tolerated the procedure well.         Disposition: PACU - hemodynamically stable.         Condition: stable   Hildred Laser, MD [facility]

## 2016-05-03 NOTE — Progress Notes (Signed)
Post Partum Day # 1, s/p SVD  Subjective: no complaints, up ad lib, voiding and tolerating PO  Objective: Temp:  [97.8 F (36.6 C)-98.2 F (36.8 C)] 97.8 F (36.6 C) (07/27 0851) Pulse Rate:  [66-129] 72 (07/27 0851) Resp:  [18-20] 20 (07/27 0851) BP: (104-120)/(52-90) 105/62 (07/27 0851) SpO2:  [97 %-100 %] 99 % (07/27 0851) Weight:  [219 lb (99.3 kg)] 219 lb (99.3 kg) (07/26 1437)  Physical Exam:  General: alert and no distress  Lungs: clear to auscultation bilaterally Breasts: normal appearance, no masses or tenderness Heart: regular rate and rhythm, S1, S2 normal, no murmur, click, rub or gallop Pelvis: Lochia: appropriate, Uterine Fundus: firm Extremities: DVT Evaluation: Negative Homan's sign. No cords or calf tenderness. No significant calf/ankle edema.   Recent Labs  05/02/16 1130 05/03/16 0513  HGB 10.6* 10.2*  HCT 31.4* 30.6*    Assessment/Plan: Plan for discharge tomorrow, Breastfeeding, Lactation consult and Contraception PP BTL scheduled for 1 pm   LOS: 1 day   Hildred Laser [facility]

## 2016-05-04 MED ORDER — DOCUSATE SODIUM 100 MG PO CAPS: 100.0000 mg | ORAL_CAPSULE | Freq: Two times a day (BID) | ORAL | 0 refills | Status: DC

## 2016-05-04 MED ORDER — VITAMIN D3 125 MCG (5000 UT) PO CAPS: 1.0000 | ORAL_CAPSULE | Freq: Every day | ORAL | 2 refills | Status: DC

## 2016-05-04 MED ORDER — IBUPROFEN 600 MG PO TABS: 600.0000 mg | ORAL_TABLET | Freq: Four times a day (QID) | ORAL | 0 refills | Status: DC

## 2016-05-04 MED ORDER — OXYCODONE HCL 5 MG PO TABS: 5.0000 mg | ORAL_TABLET | ORAL | 0 refills | Status: DC | PRN

## 2016-05-04 NOTE — Progress Notes (Signed)
Patient discharged home with infant and significant other. Discharge instructions, prescriptions and follow up appointment given to and reviewed with patient and significant other. Patient verbalized understanding. Escorted out via wheelchair by axiliary.  

## 2016-05-04 NOTE — Discharge Summary (Signed)
Obstetric Discharge Summary Reason for Admission: induction of labor Prenatal Procedures: ultrasound Intrapartum Procedures: spontaneous vaginal delivery and tubal ligation Postpartum Procedures: none Complications-Operative and Postpartum: none Hemoglobin  Date Value Ref Range Status  05/03/2016 10.2 (L) 12.0 - 16.0 g/dL Final  63/84/5364 68.0 g/dL Final   HCT  Date Value Ref Range Status  05/03/2016 30.6 (L) 35.0 - 47.0 % Final  10/26/2014 40 % Final   Hematocrit  Date Value Ref Range Status  02/21/2016 32.5 (L) 34.0 - 46.6 % Final    Physical Exam:  General: alert, cooperative and appears stated age Lochia: appropriate Uterine Fundus: firm Incision: healing well, no significant drainage DVT Evaluation: No evidence of DVT seen on physical exam. Negative Homan's sign. Calf/Ankle edema is present.  Discharge Diagnoses: Term Pregnancy-delivered and PPTL  Discharge Information: Date: 05/04/2016 Activity: pelvic rest Diet: routine Medications: PNV, Tylenol #3, Ibuprofen and Colace Condition: stable Instructions: refer to practice specific booklet Discharge to: home   Newborn Data: Live born female  Birth Weight: 7 lb 8.3 oz (3410 g) APGAR: 8, 9  Home with mother.  Chesky Heyer [employer], CNM 05/04/2016, 12:36 PM

## 2016-05-07 LAB — SURGICAL PATHOLOGY

## 2016-05-08 ENCOUNTER — Telehealth: Payer: Self-pay | Admitting: Obstetrics and Gynecology

## 2016-05-08 ENCOUNTER — Encounter: Payer: Managed Care, Other (non HMO) | Admitting: Obstetrics and Gynecology

## 2016-05-08 NOTE — Telephone Encounter (Signed)
Got a bandage on her incison Thursday where she got her tubes tied. She wants to know when she can take it off.

## 2016-05-08 NOTE — Telephone Encounter (Signed)
Spoke with pt

## 2016-06-05 ENCOUNTER — Ambulatory Visit (INDEPENDENT_AMBULATORY_CARE_PROVIDER_SITE_OTHER): Payer: Managed Care, Other (non HMO) | Admitting: Obstetrics and Gynecology

## 2016-06-05 ENCOUNTER — Encounter: Payer: Self-pay | Admitting: Obstetrics and Gynecology

## 2016-06-05 NOTE — Patient Instructions (Signed)
  Place postpartum visit patient instructions here.  

## 2016-06-05 NOTE — Progress Notes (Signed)
  Subjective:     Mary Cooper is a 35 y.o. female who presents for a postpartum visit. She is 5 weeks postpartum following a spontaneous vaginal delivery. I have fully reviewed the prenatal and intrapartum course. The delivery was at 39 gestational weeks. Outcome: spontaneous vaginal delivery. Anesthesia: epidural. Postpartum course has been uncomplicated. Baby's course has been uncomplicated. Baby is feeding by formula only x 2 weeks. Bleeding no bleeding. Bowel function is normal. Bladder function is normal. Patient is sexually active. Contraception method is tubal ligation. Postpartum depression screening: negative.  The following portions of the patient's history were reviewed and updated as appropriate: allergies, current medications, past family history, past medical history, past social history, past surgical history and problem list.  Review of Systems A comprehensive review of systems was negative.   Objective:    BP 131/76   Pulse 73   Ht 5\' 5"  (1.651 m)   Wt 197 lb 6.4 oz (89.5 kg)   Breastfeeding? No   BMI 32.85 kg/m   General:  alert, cooperative and appears stated age   Breasts:  inspection negative, no nipple discharge or bleeding, no masses or nodularity palpable  Lungs: clear to auscultation bilaterally  Heart:  regular rate and rhythm, S1, S2 normal, no murmur, click, rub or gallop  Abdomen: soft, non-tender; bowel sounds normal; no masses,  no organomegaly   Vulva:  normal  Vagina: normal vagina  Cervix:  multiparous appearance  Corpus: normal size, contour, position, consistency, mobility, non-tender  Adnexa:  normal adnexa and no mass, fullness, tenderness  Rectal Exam: Not performed.        Assessment:     5 weeks postpartum exam. Pap smear not done at today's visit. S/P BTL  Plan:    1. Contraception: tubal ligation 2. Anemia- recheck labs 3. Follow up in: 4 months for AE or as needed.

## 2016-06-06 LAB — CBC
HEMATOCRIT: 39.1 % (ref 34.0–46.6)
Hemoglobin: 12.5 g/dL (ref 11.1–15.9)
MCH: 25.3 pg — AB (ref 26.6–33.0)
MCHC: 32 g/dL (ref 31.5–35.7)
MCV: 79 fL (ref 79–97)
Platelets: 237 10*3/uL (ref 150–379)
RBC: 4.94 x10E6/uL (ref 3.77–5.28)
RDW: 16 % — AB (ref 12.3–15.4)
WBC: 5 10*3/uL (ref 3.4–10.8)

## 2016-06-06 LAB — VITAMIN D 25 HYDROXY (VIT D DEFICIENCY, FRACTURES): Vit D, 25-Hydroxy: 31.9 ng/mL (ref 30.0–100.0)

## 2016-06-06 LAB — IRON: Iron: 38 ug/dL (ref 27–159)

## 2016-10-04 ENCOUNTER — Ambulatory Visit: Payer: Self-pay | Admitting: Physician Assistant

## 2016-10-04 ENCOUNTER — Encounter: Payer: Self-pay | Admitting: Physician Assistant

## 2016-10-04 ENCOUNTER — Encounter: Payer: Managed Care, Other (non HMO) | Admitting: Obstetrics and Gynecology

## 2016-10-04 VITALS — BP 125/74 | HR 90 | Temp 98.0°F

## 2016-10-04 DIAGNOSIS — J0100 Acute maxillary sinusitis, unspecified: Secondary | ICD-10-CM

## 2016-10-04 MED ORDER — AMOXICILLIN 875 MG PO TABS: 875.0000 mg | ORAL_TABLET | Freq: Two times a day (BID) | ORAL | 0 refills | Status: DC

## 2016-10-04 MED ORDER — FLUTICASONE PROPIONATE 50 MCG/ACT NA SUSP: 2.0000 | Freq: Every day | NASAL | 6 refills | Status: DC

## 2016-10-04 NOTE — Progress Notes (Signed)
S: C/o runny nose and congestion for 3 days, no fever, chills, cp/sob, v/d; mucus is green and thick, cough is sporadic, c/o of facial and dental pain. Kids and husband have same sx Using otc meds:   O: PE: vitals wnl, nad, perrl eomi, normocephalic, tms dull, nasal mucosa red and swollen, throat injected, neck supple no lymph, lungs c t a, cv rrr, neuro intact  A:  Acute sinusitis   P: drink fluids, continue regular meds , use otc meds of choice, return if not improving in 5 days, return earlier if worsening , amoxil, flonase

## 2016-10-31 IMAGING — CT CT RENAL STONE PROTOCOL
3 of 4 series · 9 of 46 positions shown, 16 images · non-contrast
Comparison: None.

CLINICAL DATA: Patient 1 week postpartum falling a normal vaginal
birth without complication. Patient began having a left abdominal
pain radiating to the left flank suddenly today.

EXAM:
CT ABDOMEN AND PELVIS WITHOUT CONTRAST
TECHNIQUE: Multidetector CT imaging of the abdomen and pelvis was performed
following the standard protocol without IV contrast.

[Series 4: lung · axial · 0.97mm/px · z∈[-691,-606]mm · 5 of 27 slices shown, 10 images]
[im 5/27  soft-tissue]
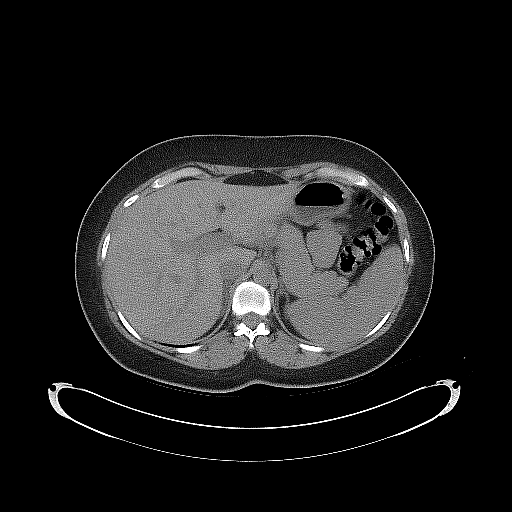
[im 5/27  bone]
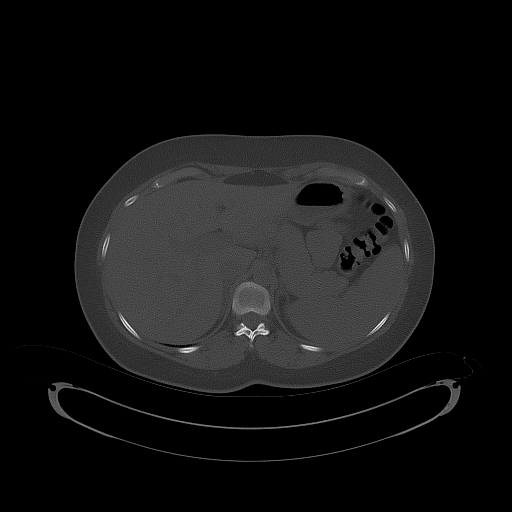
[im 9/27  soft-tissue]
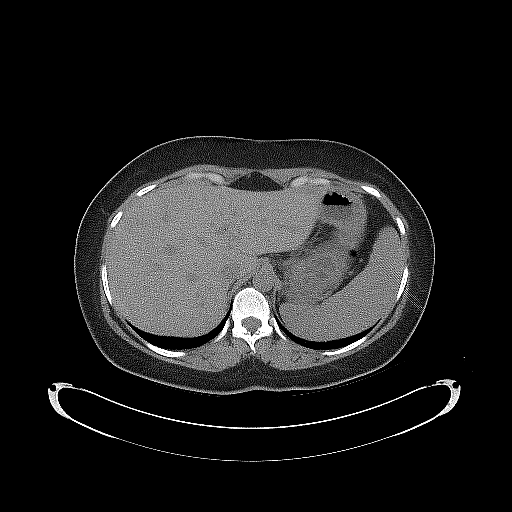
[im 9/27  lung]
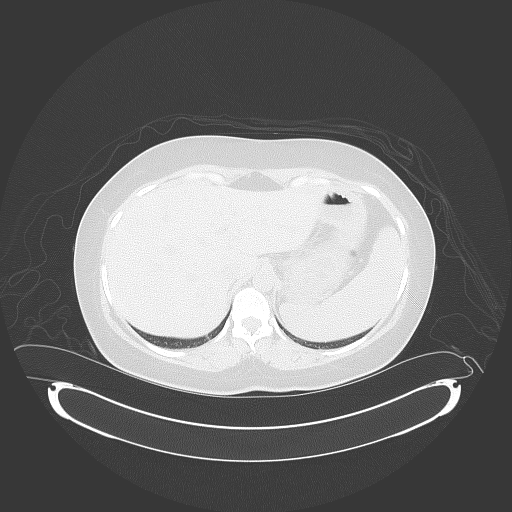
[im 14/27  soft-tissue]
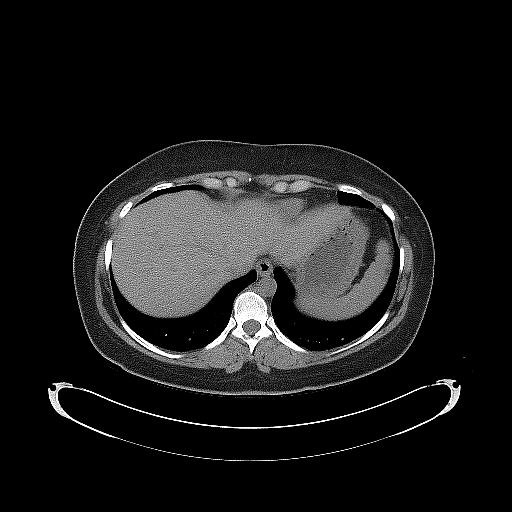
[im 14/27  lung]
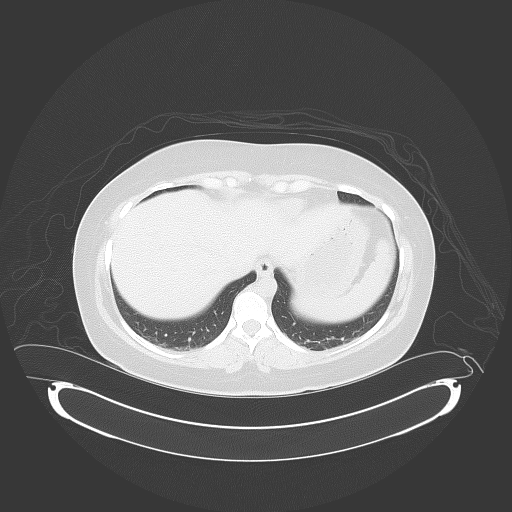
[im 18/27  soft-tissue]
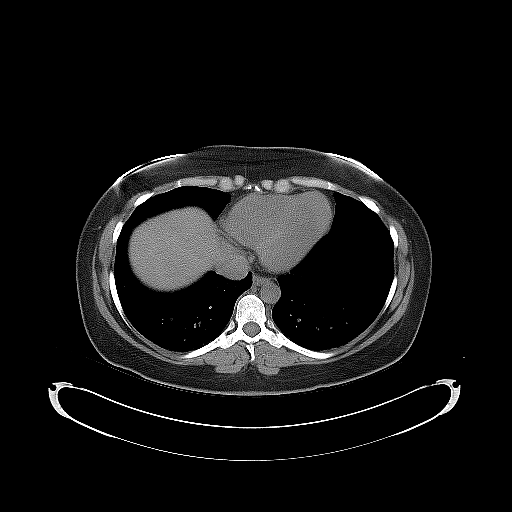
[im 18/27  lung]
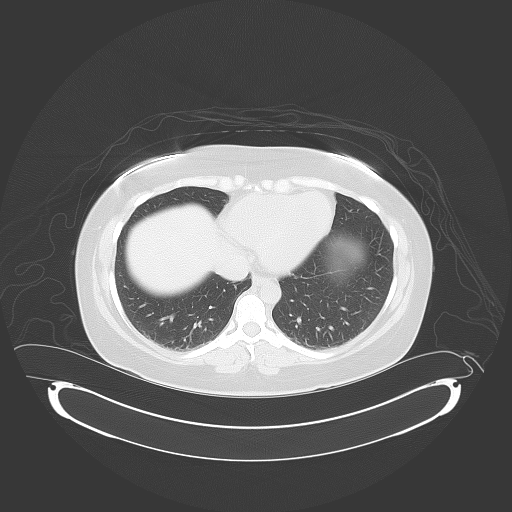
[im 22/27  soft-tissue]
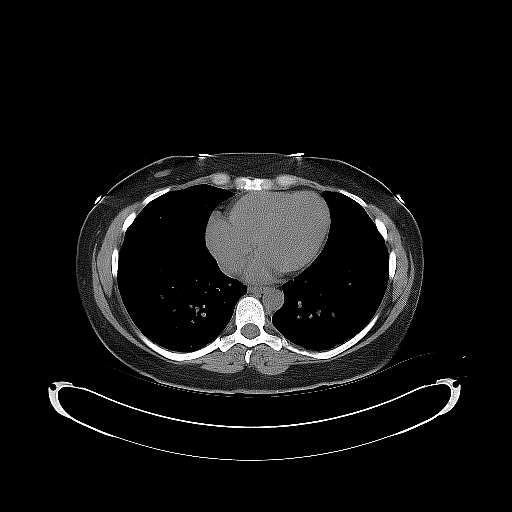
[im 22/27  lung]
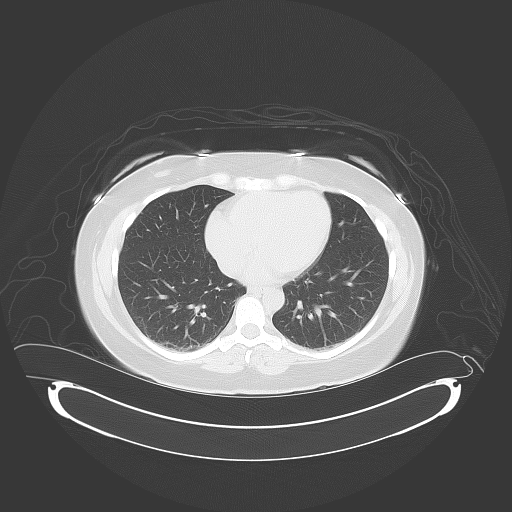

[Series 5: coronal · coronal · 0.80mm/px · 3 of 152 slices shown, 4 images]
[im 51/152  soft-tissue]
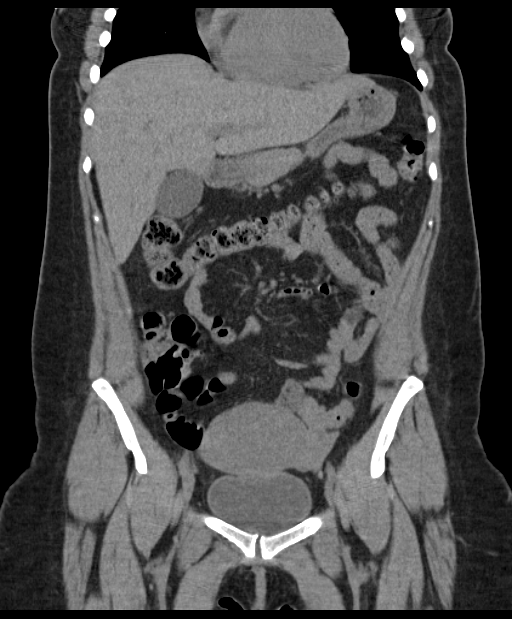
[im 68/152  soft-tissue]
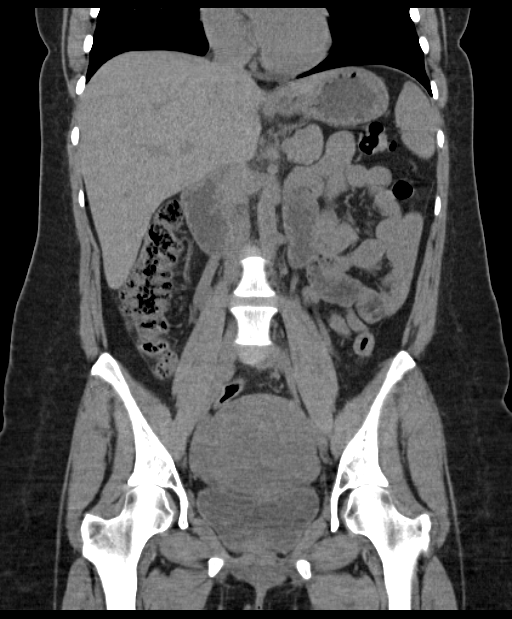
[im 68/152  bone]
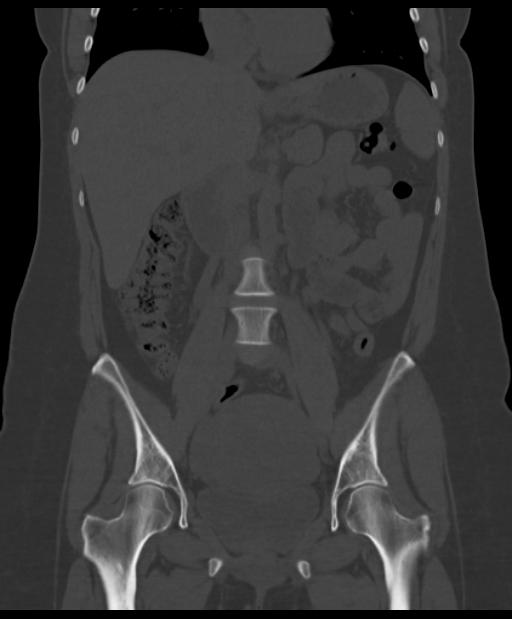
[im 84/152  soft-tissue]
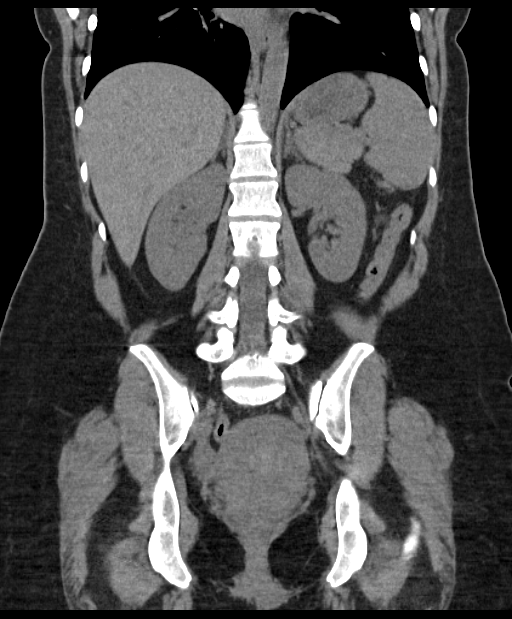

[Series 6: sagittal · sagittal · 0.69mm/px · 1 of 226 slices shown, 2 images]
[im 76/226  soft-tissue]
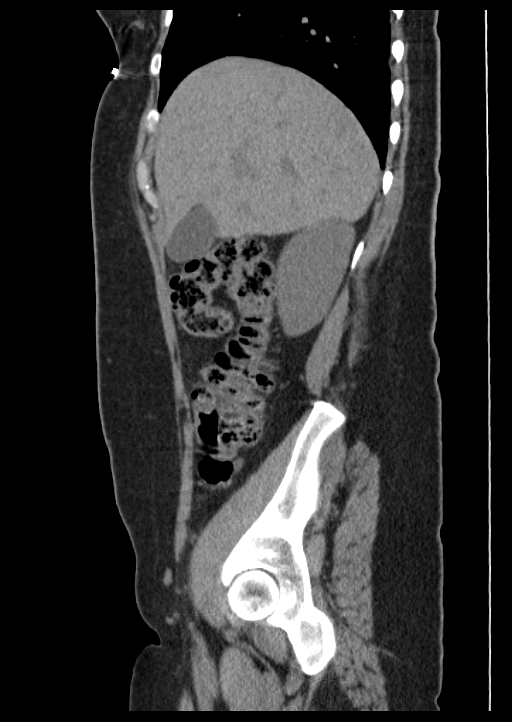
[im 76/226  bone]
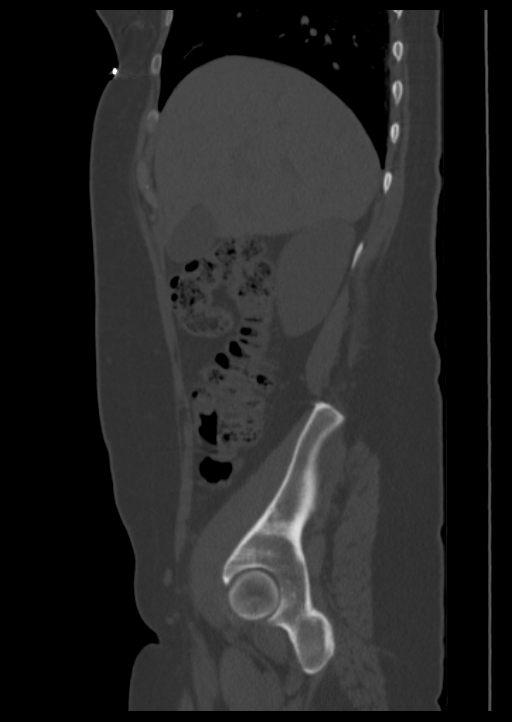

[9 of 46 positions shown; findings below may reference images not displayed]

FINDINGS: Lung bases:  Clear.  Heart normal in size.

Liver, spleen, gallbladder, pancreas, adrenal glands:  Normal.

Kidneys, ureters, bladder: Mild dilation of the right intrarenal
collecting system. Right ureter is normal course and in caliber. No
renal or ureteral stones. This mild dilation is likely due to the
enlarged postpartum uterus and extrinsic distal ureteral
compression. There are no renal masses. No left renal collecting
system dilation. Normal left ureter. Bladder is unremarkable.

Uterus and adnexa: Expected postpartum uterine enlargement with the
uterus measuring 12.5 x 7.8 x 9.5 cm. There is some increased
density projecting in the upper cervix and may reflect a small
amount of post delivery thrombus. There is no endometrial fluid. No
adnexal masses.

Lymph nodes:  No enlarged lymph nodes.

Ascites:  None.

Gastrointestinal:  Normal.  Normal appendix visualized.

Musculoskeletal:  Normal.
IMPRESSION: 1. No acute findings. No findings to explain this patient's symptoms
of left flank pain.
2. No renal or ureteral stones. Mild dilation of the right
intrarenal collecting system is presumed to be related to mild
extrinsic compression of the distal right ureter from the postpartum
uterus. This does not correlate with the left-sided symptoms.
3. Normal expected postpartum appearance of the uterus. No pelvic
masses or inflammation. No evidence of abdominal or pelvic
hemorrhage or free air. No abscess. No evidence of a complication
related to the patient's recent vaginal birth.

## 2016-12-11 ENCOUNTER — Encounter: Payer: Self-pay | Admitting: Obstetrics and Gynecology

## 2016-12-11 ENCOUNTER — Ambulatory Visit (INDEPENDENT_AMBULATORY_CARE_PROVIDER_SITE_OTHER): Payer: Managed Care, Other (non HMO) | Admitting: Obstetrics and Gynecology

## 2016-12-11 VITALS — BP 107/74 | HR 74 | Ht 65.0 in | Wt 190.9 lb

## 2016-12-11 DIAGNOSIS — Z01419 Encounter for gynecological examination (general) (routine) without abnormal findings: Secondary | ICD-10-CM | POA: Diagnosis not present

## 2016-12-11 NOTE — Patient Instructions (Signed)
 Preventive Care 18-39 Years, Female Preventive care refers to lifestyle choices and visits with your health care provider that can promote health and wellness. What does preventive care include?  A yearly physical exam. This is also called an annual well check.  Dental exams once or twice a year.  Routine eye exams. Ask your health care provider how often you should have your eyes checked.  Personal lifestyle choices, including:  Daily care of your teeth and gums.  Regular physical activity.  Eating a healthy diet.  Avoiding tobacco and drug use.  Limiting alcohol use.  Practicing safe sex.  Taking vitamin and mineral supplements as recommended by your health care provider. What happens during an annual well check? The services and screenings done by your health care provider during your annual well check will depend on your age, overall health, lifestyle risk factors, and family history of disease. Counseling  Your health care provider may ask you questions about your:  Alcohol use.  Tobacco use.  Drug use.  Emotional well-being.  Home and relationship well-being.  Sexual activity.  Eating habits.  Work and work environment.  Method of birth control.  Menstrual cycle.  Pregnancy history. Screening  You may have the following tests or measurements:  Height, weight, and BMI.  Diabetes screening. This is done by checking your blood sugar (glucose) after you have not eaten for a while (fasting).  Blood pressure.  Lipid and cholesterol levels. These may be checked every 5 years starting at age 20.  Skin check.  Hepatitis C blood test.  Hepatitis B blood test.  Sexually transmitted disease (STD) testing.  BRCA-related cancer screening. This may be done if you have a family history of breast, ovarian, tubal, or peritoneal cancers.  Pelvic exam and Pap test. This may be done every 3 years starting at age 21. Starting at age 30, this may be done  every 5 years if you have a Pap test in combination with an HPV test. Discuss your test results, treatment options, and if necessary, the need for more tests with your health care provider. Vaccines  Your health care provider may recommend certain vaccines, such as:  Influenza vaccine. This is recommended every year.  Tetanus, diphtheria, and acellular pertussis (Tdap, Td) vaccine. You may need a Td booster every 10 years.  Varicella vaccine. You may need this if you have not been vaccinated.  HPV vaccine. If you are 26 or younger, you may need three doses over 6 months.  Measles, mumps, and rubella (MMR) vaccine. You may need at least one dose of MMR. You may also need a second dose.  Pneumococcal 13-valent conjugate (PCV13) vaccine. You may need this if you have certain conditions and were not previously vaccinated.  Pneumococcal polysaccharide (PPSV23) vaccine. You may need one or two doses if you smoke cigarettes or if you have certain conditions.  Meningococcal vaccine. One dose is recommended if you are age 19-21 years and a first-year college student living in a residence hall, or if you have one of several medical conditions. You may also need additional booster doses.  Hepatitis A vaccine. You may need this if you have certain conditions or if you travel or work in places where you may be exposed to hepatitis A.  Hepatitis B vaccine. You may need this if you have certain conditions or if you travel or work in places where you may be exposed to hepatitis B.  Haemophilus influenzae type b (Hib) vaccine. You may need   this if you have certain risk factors. Talk to your health care provider about which screenings and vaccines you need and how often you need them. This information is not intended to replace advice given to you by your health care provider. Make sure you discuss any questions you have with your health care provider. Document Released: 11/20/2001 Document Revised:  06/13/2016 Document Reviewed: 07/26/2015 Elsevier Interactive Patient Education  2017 Elsevier Inc.  

## 2016-12-11 NOTE — Progress Notes (Signed)
Subjective:   Mary Cooper is a 36 y.o. [id number] Caucasian female here for a routine well-woman exam.  Patient's last menstrual period was 11/20/2016.    Current complaints: none PCP: Burnett ShengHedrick       does desire labs  Social History: Sexual: heterosexual Marital Status: married Living situation: with family Occupation: at [employer] Tobacco/alcohol: no tobacco use Illicit drugs: no history of illicit drug use  The following portions of the patient's history were reviewed and updated as appropriate: allergies, current medications, past family history, past medical history, past social history, past surgical history and problem list.  Past Medical History Past Medical History:  Diagnosis Date  . GERD (gastroesophageal reflux disease)     Past Surgical History Past Surgical History:  Procedure Laterality Date  . DILATION AND CURETTAGE OF UTERUS  2006  . MOUTH SURGERY    . TUBAL LIGATION N/A 05/03/2016   Procedure: POST PARTUM TUBAL LIGATION;  Surgeon: Hildred LaserAnika Cherry, MD;  Location: [facility] ORS;  Service: Gynecology;  Laterality: N/A;    Gynecologic History [id number]  Patient's last menstrual period was 11/20/2016. Contraception: tubal ligation Last Pap: 2017. Results were: normal   Obstetric History OB History  Gravida Para Term Preterm AB Living  6 4 4   2 4   SAB TAB Ectopic Multiple Live Births  1 1   0 4    # Outcome Date GA Lbr Len/2nd Weight Sex Delivery Anes PTL Lv  6 Term 05/02/16 938w3d / 00:07 7 lb 8.3 oz (3.41 kg) F Vag-Spont EPI  LIV  5 Term 06/2015 7335w0d  7 lb 10 oz (3.459 kg) M    LIV  4 Term 04/2013 7299w0d  7 lb 13 oz (3.544 kg) M    LIV  3 TAB 2009          2 SAB 2006          1 Term 10/2003 5367w6d  9 lb 6 oz (4.252 kg) M Vag-Spont   LIV      Current Medications No current outpatient prescriptions on file prior to visit.   No current facility-administered medications on file prior to visit.     Review of Systems Patient denies any headaches, blurred vision,  shortness of breath, chest pain, abdominal pain, problems with bowel movements, urination, or intercourse.  Objective:  BP 107/74   Pulse 74   Ht 5\' 5"  (1.651 m)   Wt 190 lb 14.4 oz (86.6 kg)   LMP 11/20/2016   BMI 31.77 kg/m  Physical Exam  General:  Well developed, well nourished, no acute distress. She is alert and oriented x3. Skin:  Warm and dry Neck:  Midline trachea, no thyromegaly or nodules Cardiovascular: Regular rate and rhythm, no murmur heard Lungs:  Effort normal, all lung fields clear to auscultation bilaterally Breasts:  No dominant palpable mass, retraction, or nipple discharge Abdomen:  Soft, non tender, no hepatosplenomegaly or masses Pelvic:  External genitalia is normal in appearance.  The vagina is normal in appearance. The cervix is bulbous, no CMT.  Thin prep pap is not done . Uterus is felt to be normal size, shape, and contour.  No adnexal masses or tenderness noted.  Extremities:  No swelling or varicosities noted Psych:  She has a normal mood and affect  Assessment:   Healthy well-woman exam Obesity   Plan:  Screening labs obtained Encouraged weight loss F/U 1 year for AE, or sooner if needed   Melody Suzan NailerN Shambley, CNM

## 2016-12-12 LAB — COMPREHENSIVE METABOLIC PANEL
ALBUMIN: 4.6 g/dL (ref 3.5–5.5)
ALK PHOS: 92 IU/L (ref 39–117)
ALT: 8 IU/L (ref 0–32)
AST: 16 IU/L (ref 0–40)
Albumin/Globulin Ratio: 1.9 (ref 1.2–2.2)
BUN/Creatinine Ratio: 10 (ref 9–23)
BUN: 6 mg/dL (ref 6–20)
Bilirubin Total: <0.2 mg/dL (ref 0.0–1.2)
CALCIUM: 8.9 mg/dL (ref 8.7–10.2)
CO2: 25 mmol/L (ref 18–29)
CREATININE: 0.61 mg/dL (ref 0.57–1.00)
Chloride: 100 mmol/L (ref 96–106)
GFR calc Af Amer: 137 mL/min/{1.73_m2} (ref >59)
GFR, EST NON AFRICAN AMERICAN: 119 mL/min/{1.73_m2} (ref >59)
GLUCOSE: 90 mg/dL (ref 65–99)
Globulin, Total: 2.4 g/dL (ref 1.5–4.5)
Potassium: 4.3 mmol/L (ref 3.5–5.2)
Sodium: 139 mmol/L (ref 134–144)
Total Protein: 7 g/dL (ref 6.0–8.5)

## 2016-12-12 LAB — LIPID PANEL
Chol/HDL Ratio: 3.3 ratio units (ref 0.0–4.4)
Cholesterol, Total: 164 mg/dL (ref 100–199)
HDL: 49 mg/dL (ref >39)
LDL CALC: 93 mg/dL (ref 0–99)
Triglycerides: 108 mg/dL (ref 0–149)
VLDL Cholesterol Cal: 22 mg/dL (ref 5–40)

## 2016-12-12 LAB — HEMOGLOBIN A1C
ESTIMATED AVERAGE GLUCOSE: 117 mg/dL
HEMOGLOBIN A1C: 5.7 % — AB (ref 4.8–5.6)

## 2017-08-06 IMAGING — US US PELVIS COMPLETE
2 series · 13 of 25 positions shown · non-contrast
Comparison: None.

CLINICAL DATA: Postpartum for vaginal delivery 1 week ago.
Continued bleeding for 1 day.

EXAM:
TRANSABDOMINAL ULTRASOUND OF PELVIS
TECHNIQUE: Transabdominal ultrasound examination of the pelvis was performed
including evaluation of the uterus, ovaries, adnexal regions, and
pelvic cul-de-sac.

[Series 1: us pelvis complete · 0.30mm/px · 12 of 59 slices shown (1 of 2)]
[im 1/59]
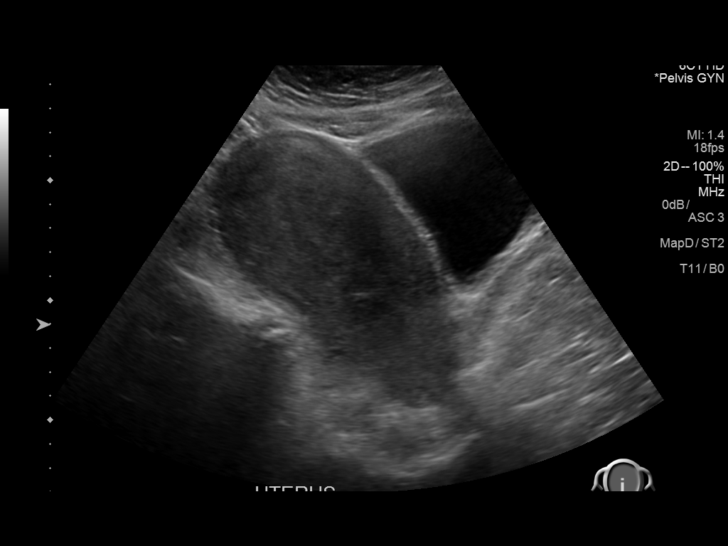
[im 6/59]
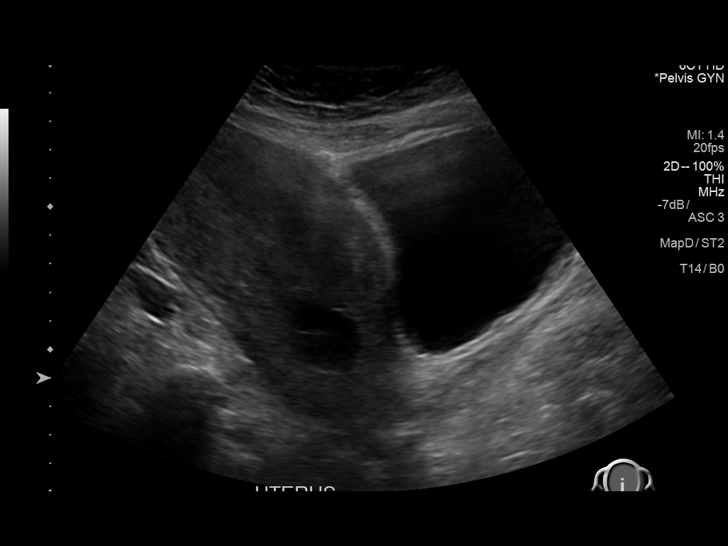
[im 11/59]
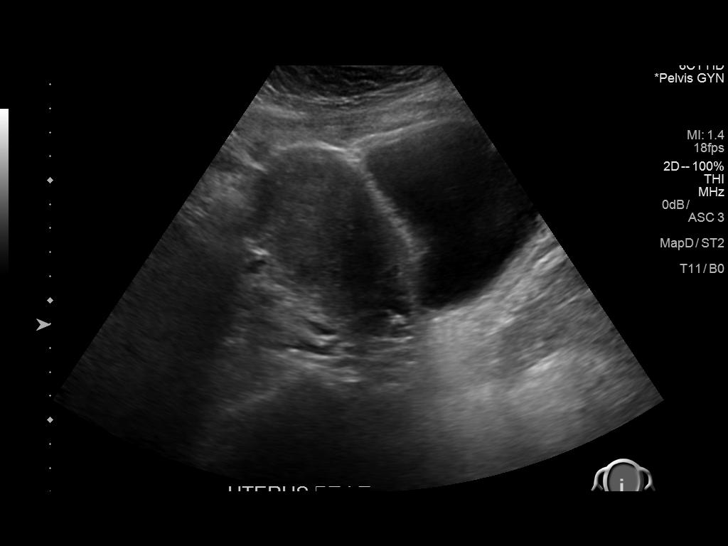
[im 16/59]
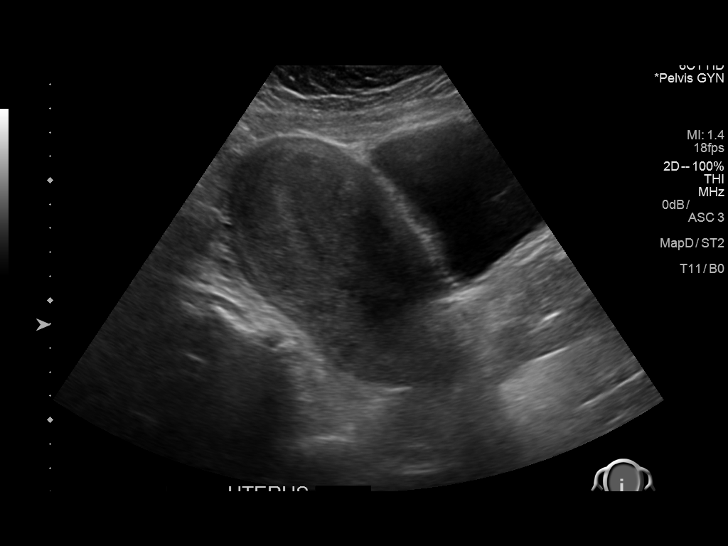
[im 21/59]
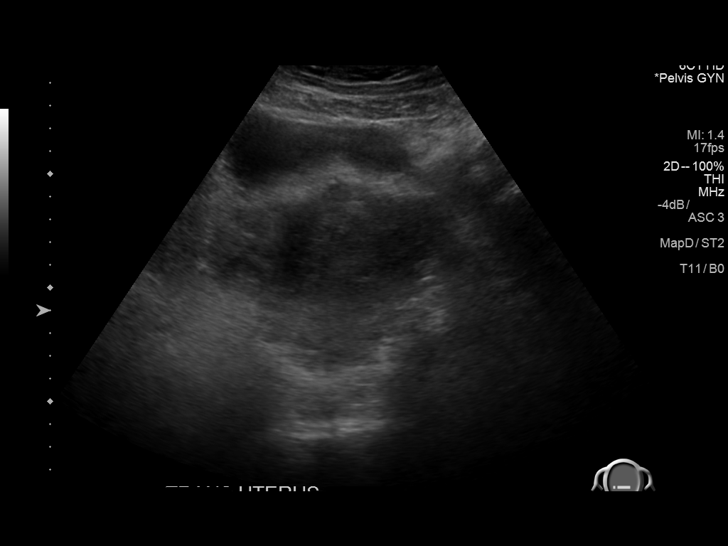
[im 26/59]
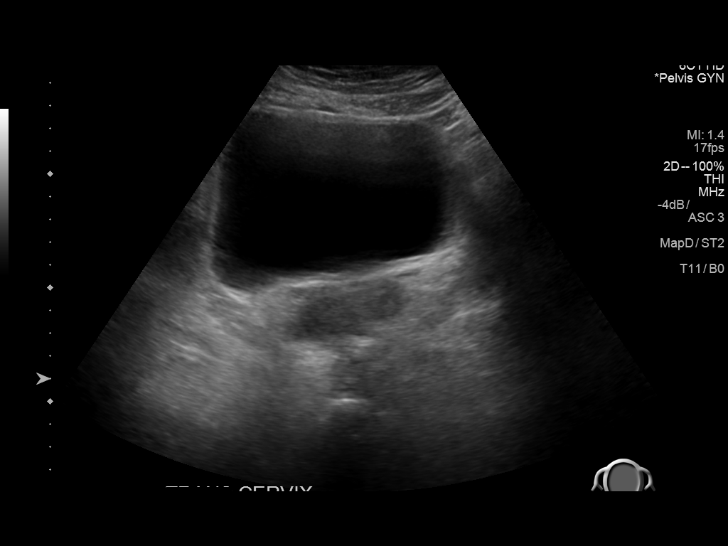
[im 31/59]
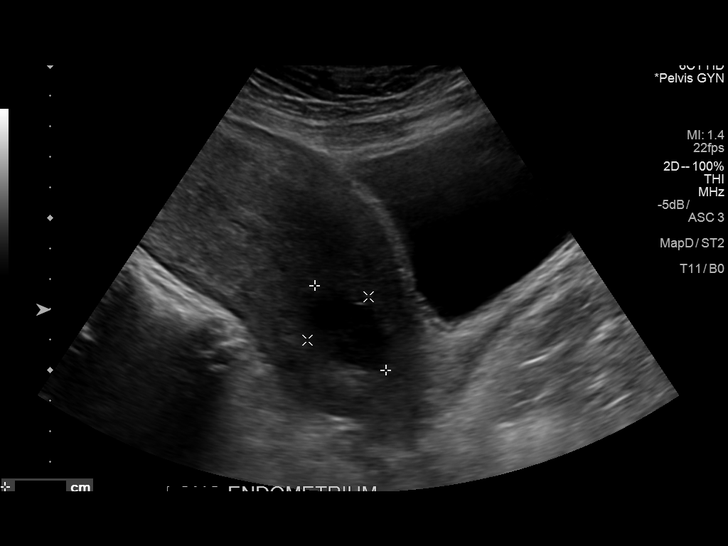
[im 36/59]
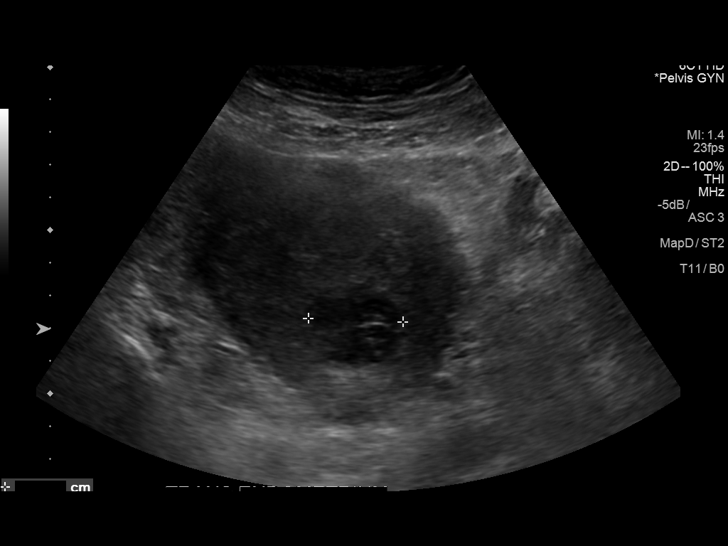
[im 41/59]
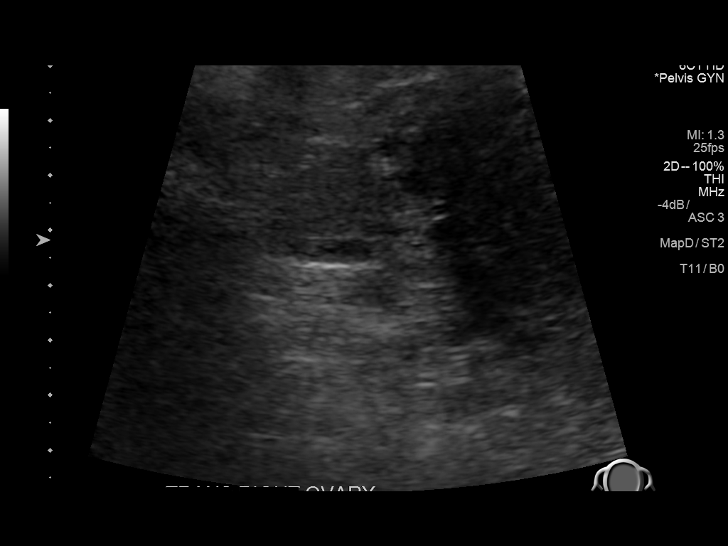
[im 46/59]
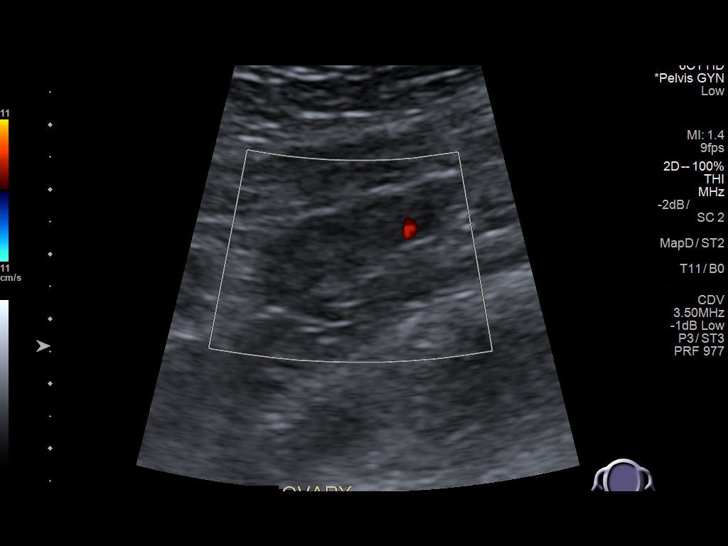
[im 51/59]
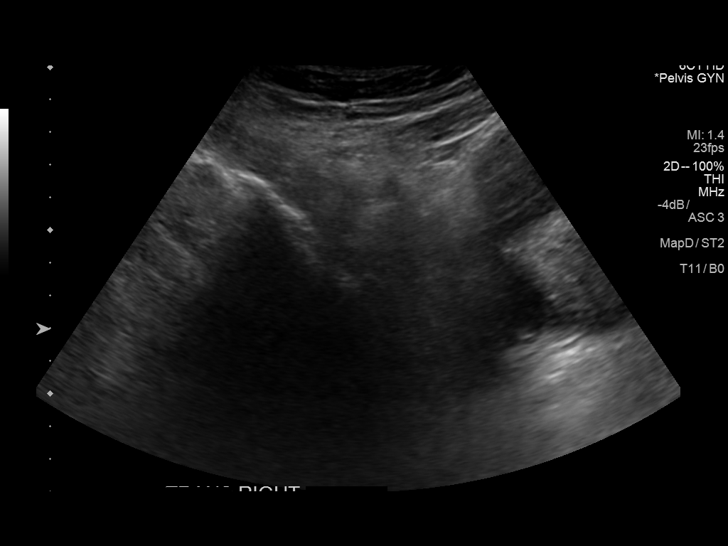
[im 56/59]
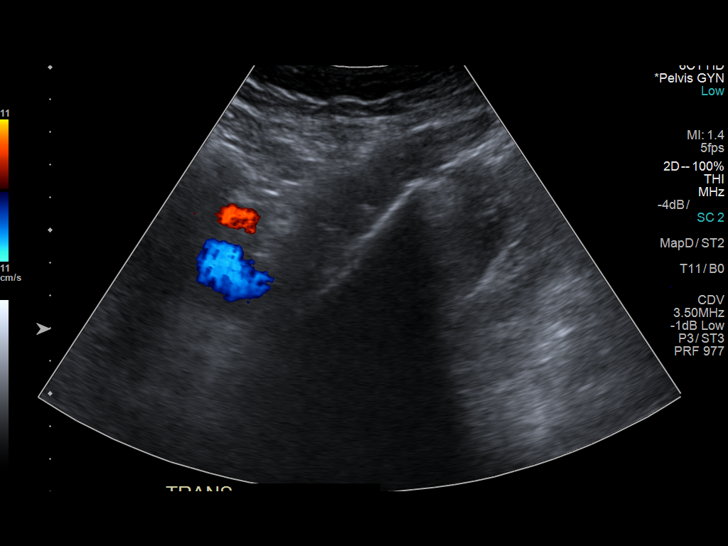

[Series 1001: us pelvis complete · 0.20mm/px · 1 of 3 slices shown (2 of 2)]
[im 1/3]
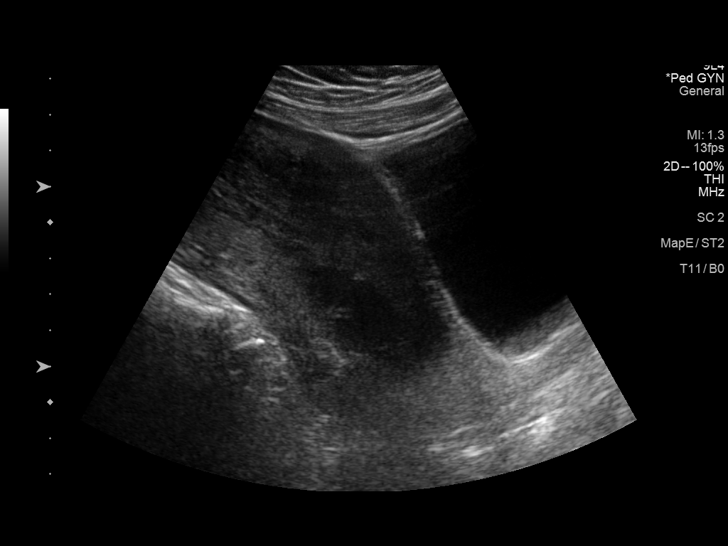

[13 of 25 positions shown; findings below may reference images not displayed]

FINDINGS: Uterus

Measurements: 13.4 by 6.8 by 9.3 cm. No fibroids or other mass
visualized.

Endometrium

Thickness: Generally the thickness is about 1.1 cm which is normal
at this stage postpartum. In the lower uterine segment, there is a
complex but hypoechoic region measuring 3.3 by 2.1 by 2.9 cm along
the endometrial canal. On Doppler assessment there is no internal
flow..

Right ovary

Measurements: 3.3 by 2.2 by 2.6 cm. Normal appearance/no adnexal
mass.

Left ovary

Measurements: 3.3 by 1.2 by 2.5 cm. Normal appearance/no adnexal
mass.

Other findings:  No free fluid
IMPRESSION: 1. In the lower uterine segment, there is a hypoechoic but complex
3.3 by 2.1 by 2.9 cm avascular structure along the endometrial
canal. Statistically this is likely to be a blood clot. About 20% of
retained products of conception can be avascular on ultrasound, and
accordingly this could conceivably represent retained products of
conception. If the patient is stable and with no signs of infection,
expected management may be reasonable ; if bleeding persists or
other symptoms further favor retained products of conception, then
surgical evacuation may be necessary.

## 2017-08-12 ENCOUNTER — Encounter: Payer: Self-pay | Admitting: Physician Assistant

## 2017-08-12 ENCOUNTER — Ambulatory Visit: Payer: Self-pay | Admitting: Physician Assistant

## 2017-08-12 VITALS — BP 100/70 | HR 77 | Temp 98.5°F | Resp 16

## 2017-08-12 DIAGNOSIS — R300 Dysuria: Secondary | ICD-10-CM

## 2017-08-12 LAB — POCT URINALYSIS DIPSTICK
BILIRUBIN UA: NEGATIVE
Blood, UA: NEGATIVE
Glucose, UA: NEGATIVE
KETONES UA: NEGATIVE
LEUKOCYTES UA: NEGATIVE
Nitrite, UA: NEGATIVE
Protein, UA: NEGATIVE
Spec Grav, UA: 1.02 (ref 1.010–1.025)
Urobilinogen, UA: 0.2 E.U./dL
pH, UA: 7 (ref 5.0–8.0)

## 2017-08-12 NOTE — Progress Notes (Signed)
S:  C/o uti sx for 2 days, burning, urgency, frequency, urine has strong odor,  denies vaginal discharge, abdominal pain or flank pain:  Remainder ros neg  O:  Vitals wnl, nad, no cva tenderness, ua wnl  A: dysuria  P: urine culture, increase water intake, add cranberry juice, return if not improving in 2 -3 days, return earlier if worsening, discussed pyelonephritis sx

## 2017-08-14 LAB — URINE CULTURE

## 2017-10-09 ENCOUNTER — Ambulatory Visit: Payer: Self-pay | Admitting: Emergency Medicine

## 2017-10-09 VITALS — BP 110/70 | HR 75 | Temp 98.2°F | Resp 16

## 2017-10-09 DIAGNOSIS — B309 Viral conjunctivitis, unspecified: Secondary | ICD-10-CM

## 2017-10-09 MED ORDER — OFLOXACIN 0.3 % OP SOLN: 1.0000 [drp] | Freq: Four times a day (QID) | OPHTHALMIC | 0 refills | Status: DC

## 2017-10-09 NOTE — Progress Notes (Signed)
Subjective. Patient enters with a chief complaint of discomfort in her right. Pertinent history reveals 2 of her children were ill 7-10 days ago with conjunctivitis. They both responded to antibiotic eyedrops. 3 days ago she started having some discomfort in her left ear. This seems somewhat better now but she has had a history of left otitis media with residual fluid. She denies fevers chills or cough. She has had no problems with her vision. Review of systems. Noncontributory except as relates to present illness. Objective Visual acuity right 20/25 and left eye 20/20. Pupils equal and reactive to light direct and consensual. There is some redness over the inferior lateral portion of the right lid.. Right TM is normal. Left TM appears to have some fluid resident. There is no redness or purulence Nose is normal. Throat without redness or exudate. Neck supple without adenopathy. Chest clear to auscultation. Assessment. Patient has a left serous otitis media with signs and symptoms consistent with an early right conjunctivitis. Plan She will be on Ocuflox eyedrops. She will take Sudafed and use the Flonase she has at home. She was given a written prescription for Zithromax to have on hand in case the left ear discomfort became recurrent.

## 2017-12-24 ENCOUNTER — Ambulatory Visit (INDEPENDENT_AMBULATORY_CARE_PROVIDER_SITE_OTHER): Payer: Managed Care, Other (non HMO) | Admitting: Obstetrics and Gynecology

## 2017-12-24 ENCOUNTER — Encounter: Payer: Self-pay | Admitting: Obstetrics and Gynecology

## 2017-12-24 VITALS — BP 108/75 | HR 65 | Ht 65.0 in | Wt 187.3 lb

## 2017-12-24 DIAGNOSIS — R300 Dysuria: Secondary | ICD-10-CM | POA: Diagnosis not present

## 2017-12-24 DIAGNOSIS — Z01411 Encounter for gynecological examination (general) (routine) with abnormal findings: Secondary | ICD-10-CM

## 2017-12-24 DIAGNOSIS — N3000 Acute cystitis without hematuria: Secondary | ICD-10-CM | POA: Diagnosis not present

## 2017-12-24 DIAGNOSIS — E669 Obesity, unspecified: Secondary | ICD-10-CM | POA: Diagnosis not present

## 2017-12-24 LAB — POCT URINALYSIS DIPSTICK
BILIRUBIN UA: NEGATIVE
Glucose, UA: NEGATIVE
KETONES UA: NEGATIVE
Nitrite, UA: POSITIVE
PH UA: 6 (ref 5.0–8.0)
Protein, UA: NEGATIVE
Spec Grav, UA: 1.015 (ref 1.010–1.025)
UROBILINOGEN UA: 0.2 U/dL

## 2017-12-24 MED ORDER — NITROFURANTOIN MONOHYD MACRO 100 MG PO CAPS: 100.0000 mg | ORAL_CAPSULE | Freq: Two times a day (BID) | ORAL | 1 refills | Status: DC

## 2017-12-24 NOTE — Progress Notes (Signed)
Subjective:   Mary Cooper is a 37 y.o. [phone] Caucasian female here for a routine well-woman exam.  Patient's last menstrual period was 12/01/2017.    Current complaints: RLQ pain a few days around ovulation, but always on right side. Also bad odor with urine and burning with urination.  PCP: Burnett Sheng       does desire labs  Social History: Sexual: heterosexual Marital Status: married Living situation: with family Occupation: [employer] Tobacco/alcohol: no tobacco use Illicit drugs: no history of illicit drug use  The following portions of the patient's history were reviewed and updated as appropriate: allergies, current medications, past family history, past medical history, past social history, past surgical history and problem list.  Past Medical History Past Medical History:  Diagnosis Date  . GERD (gastroesophageal reflux disease)     Past Surgical History Past Surgical History:  Procedure Laterality Date  . DILATION AND CURETTAGE OF UTERUS  2006  . MOUTH SURGERY    . TUBAL LIGATION N/A 05/03/2016   Procedure: POST PARTUM TUBAL LIGATION;  Surgeon: Hildred Laser, MD;  Location: [facility] ORS;  Service: Gynecology;  Laterality: N/A;    Gynecologic History [id number]  Patient's last menstrual period was 12/01/2017. Contraception: tubal ligation Last Pap: 2017. Results were: normal   Obstetric History OB History  Gravida Para Term Preterm AB Living  6 4 4   2 4   SAB TAB Ectopic Multiple Live Births  1 1   0 4    # Outcome Date GA Lbr Len/2nd Weight Sex Delivery Anes PTL Lv  6 Term 05/02/16 [redacted]w[redacted]d / 00:07 7 lb 8.3 oz (3.41 kg) F Vag-Spont EPI  LIV  5 Term 06/2015 [redacted]w[redacted]d  7 lb 10 oz (3.459 kg) M    LIV  4 Term 04/2013 [redacted]w[redacted]d  7 lb 13 oz (3.544 kg) M    LIV  3 TAB 2009          2 SAB 2006          1 Term 10/2003 [redacted]w[redacted]d  9 lb 6 oz (4.252 kg) M Vag-Spont   LIV      Current Medications Current Outpatient Medications on File Prior to Visit  Medication Sig Dispense Refill  .  ofloxacin (OCUFLOX) 0.3 % ophthalmic solution Place 1 drop into the right eye 4 (four) times daily. (Patient not taking: Reported on 12/24/2017) 5 mL 0   No current facility-administered medications on file prior to visit.     Review of Systems Patient denies any headaches, blurred vision, shortness of breath, chest pain, abdominal pain, problems with bowel movements, urination, or intercourse.  Objective:  BP 108/75   Pulse 65   Ht 5\' 5"  (1.651 m)   Wt 187 lb 4.8 oz (85 kg)   LMP 12/01/2017   BMI 31.17 kg/m  Physical Exam  General:  Well developed, well nourished, no acute distress. She is alert and oriented x3. Skin:  Warm and dry Neck:  Midline trachea, no thyromegaly or nodules Cardiovascular: Regular rate and rhythm, no murmur heard Lungs:  Effort normal, all lung fields clear to auscultation bilaterally Breasts:  No dominant palpable mass, retraction, or nipple discharge Abdomen:  Soft, non tender, no hepatosplenomegaly or masses Pelvic:  External genitalia is normal in appearance.  The vagina is normal in appearance. The cervix is bulbous, no CMT.  Thin prep pap is not done . Uterus is felt to be normal size, shape, and contour.  No adnexal masses or tenderness noted. UA + Nitrites  Extremities:  No swelling or varicosities noted Psych:  She has a normal mood and affect  Assessment:   Healthy well-woman exam Obesity UTI Pre-diabetes  Plan:  Labs obtained and will follow up accordingly Treated with Macrobid for UTI F/U 1 year for AE, or sooner if needed   Ramonita Koenig Suzan NailerN Allante Whitmire, CNM

## 2017-12-24 NOTE — Patient Instructions (Signed)
Preventive Care 18-39 Years, Female Preventive care refers to lifestyle choices and visits with your health care provider that can promote health and wellness. What does preventive care include?  A yearly physical exam. This is also called an annual well check.  Dental exams once or twice a year.  Routine eye exams. Ask your health care provider how often you should have your eyes checked.  Personal lifestyle choices, including: ? Daily care of your teeth and gums. ? Regular physical activity. ? Eating a healthy diet. ? Avoiding tobacco and drug use. ? Limiting alcohol use. ? Practicing safe sex. ? Taking vitamin and mineral supplements as recommended by your health care provider. What happens during an annual well check? The services and screenings done by your health care provider during your annual well check will depend on your age, overall health, lifestyle risk factors, and family history of disease. Counseling Your health care provider may ask you questions about your:  Alcohol use.  Tobacco use.  Drug use.  Emotional well-being.  Home and relationship well-being.  Sexual activity.  Eating habits.  Work and work Statistician.  Method of birth control.  Menstrual cycle.  Pregnancy history.  Screening You may have the following tests or measurements:  Height, weight, and BMI.  Diabetes screening. This is done by checking your blood sugar (glucose) after you have not eaten for a while (fasting).  Blood pressure.  Lipid and cholesterol levels. These may be checked every 5 years starting at age 38.  Skin check.  Hepatitis C blood test.  Hepatitis B blood test.  Sexually transmitted disease (STD) testing.  BRCA-related cancer screening. This may be done if you have a family history of breast, ovarian, tubal, or peritoneal cancers.  Pelvic exam and Pap test. This may be done every 3 years starting at age 38. Starting at age 30, this may be done  every 5 years if you have a Pap test in combination with an HPV test.  Discuss your test results, treatment options, and if necessary, the need for more tests with your health care provider. Vaccines Your health care provider may recommend certain vaccines, such as:  Influenza vaccine. This is recommended every year.  Tetanus, diphtheria, and acellular pertussis (Tdap, Td) vaccine. You may need a Td booster every 10 years.  Varicella vaccine. You may need this if you have not been vaccinated.  HPV vaccine. If you are 39 or younger, you may need three doses over 6 months.  Measles, mumps, and rubella (MMR) vaccine. You may need at least one dose of MMR. You may also need a second dose.  Pneumococcal 13-valent conjugate (PCV13) vaccine. You may need this if you have certain conditions and were not previously vaccinated.  Pneumococcal polysaccharide (PPSV23) vaccine. You may need one or two doses if you smoke cigarettes or if you have certain conditions.  Meningococcal vaccine. One dose is recommended if you are age 68-21 years and a first-year college student living in a residence hall, or if you have one of several medical conditions. You may also need additional booster doses.  Hepatitis A vaccine. You may need this if you have certain conditions or if you travel or work in places where you may be exposed to hepatitis A.  Hepatitis B vaccine. You may need this if you have certain conditions or if you travel or work in places where you may be exposed to hepatitis B.  Haemophilus influenzae type b (Hib) vaccine. You may need this  if you have certain risk factors.  Talk to your health care provider about which screenings and vaccines you need and how often you need them. This information is not intended to replace advice given to you by your health care provider. Make sure you discuss any questions you have with your health care provider. Document Released: 11/20/2001 Document Revised:  06/13/2016 Document Reviewed: 07/26/2015 Elsevier Interactive Patient Education  2018 Elsevier Inc.  

## 2017-12-25 LAB — LIPID PANEL
CHOL/HDL RATIO: 3.3 ratio (ref 0.0–4.4)
Cholesterol, Total: 153 mg/dL (ref 100–199)
HDL: 46 mg/dL (ref >39)
LDL CALC: 94 mg/dL (ref 0–99)
TRIGLYCERIDES: 67 mg/dL (ref 0–149)
VLDL CHOLESTEROL CAL: 13 mg/dL (ref 5–40)

## 2017-12-25 LAB — THYROID PANEL WITH TSH
Free Thyroxine Index: 1.8 (ref 1.2–4.9)
T3 Uptake Ratio: 20 % — ABNORMAL LOW (ref 24–39)
T4, Total: 8.9 ug/dL (ref 4.5–12.0)
TSH: 2.8 u[IU]/mL (ref 0.450–4.500)

## 2017-12-25 LAB — COMPREHENSIVE METABOLIC PANEL
A/G RATIO: 1.7 (ref 1.2–2.2)
ALK PHOS: 89 IU/L (ref 39–117)
ALT: 8 IU/L (ref 0–32)
AST: 16 IU/L (ref 0–40)
Albumin: 4.6 g/dL (ref 3.5–5.5)
BILIRUBIN TOTAL: 0.3 mg/dL (ref 0.0–1.2)
BUN/Creatinine Ratio: 12 (ref 9–23)
BUN: 8 mg/dL (ref 6–20)
CHLORIDE: 104 mmol/L (ref 96–106)
CO2: 23 mmol/L (ref 20–29)
Calcium: 9.5 mg/dL (ref 8.7–10.2)
Creatinine, Ser: 0.69 mg/dL (ref 0.57–1.00)
GFR calc Af Amer: 130 mL/min/{1.73_m2} (ref >59)
GFR calc non Af Amer: 113 mL/min/{1.73_m2} (ref >59)
GLOBULIN, TOTAL: 2.7 g/dL (ref 1.5–4.5)
Glucose: 88 mg/dL (ref 65–99)
POTASSIUM: 4.7 mmol/L (ref 3.5–5.2)
SODIUM: 142 mmol/L (ref 134–144)
Total Protein: 7.3 g/dL (ref 6.0–8.5)

## 2017-12-25 LAB — HEMOGLOBIN A1C
ESTIMATED AVERAGE GLUCOSE: 123 mg/dL
HEMOGLOBIN A1C: 5.9 % — AB (ref 4.8–5.6)

## 2017-12-26 LAB — URINE CULTURE

## 2018-03-03 ENCOUNTER — Other Ambulatory Visit: Payer: Self-pay

## 2018-03-03 ENCOUNTER — Encounter: Payer: Self-pay | Admitting: Emergency Medicine

## 2018-03-03 ENCOUNTER — Emergency Department
Admission: EM | Admit: 2018-03-03 | Discharge: 2018-03-03 | Disposition: A | Discharge status: Home / Self Care | Payer: Managed Care, Other (non HMO) | Attending: Emergency Medicine | Admitting: Emergency Medicine

## 2018-03-03 DIAGNOSIS — L559 Sunburn, unspecified: Secondary | ICD-10-CM

## 2018-03-03 DIAGNOSIS — L550 Sunburn of first degree: Secondary | ICD-10-CM | POA: Insufficient documentation

## 2018-03-03 DIAGNOSIS — Z87891 Personal history of nicotine dependence: Secondary | ICD-10-CM | POA: Insufficient documentation

## 2018-03-03 MED ORDER — HYDROCODONE-ACETAMINOPHEN 5-325 MG PO TABS
1.0000 | ORAL_TABLET | Freq: Once | ORAL | Status: AC
  Administered 2018-03-03: 1 via ORAL
  Filled 2018-03-03: qty 1

## 2018-03-03 MED ORDER — HYDROCODONE-ACETAMINOPHEN 5-325 MG PO TABS: 1.0000 | ORAL_TABLET | ORAL | 0 refills | Status: DC | PRN

## 2018-03-03 NOTE — ED Provider Notes (Signed)
[facility] Emergency Department Provider Note  Time seen: 11:03 PM  I have reviewed the triage vital signs and the nursing notes.   HISTORY  Chief Complaint Sunburn    HPI Mary Cooper is a 37 y.o. female with no significant past medical history presents to the emergency department for diffuse sunburn.  According to the patient yesterday she was at the beach, knew that she got burnt but did not realize how badly until this morning.  Patient states she was having significant pain in her legs and upper back.  While driving back from the beach today she noted that her feet had swollen significantly as well she became concerned also felt somewhat jittery so she came to the emergency department for evaluation.  Patient states a jittery feeling has since gone away.  Largely negative review of systems otherwise.   Past Medical History:  Diagnosis Date  . GERD (gastroesophageal reflux disease)     Patient Active Problem List   Diagnosis Date Noted  . 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 DILATION AND CURETTAGE OF UTERUS  2006  . MOUTH SURGERY    . TUBAL LIGATION N/A 05/03/2016   Procedure: POST PARTUM TUBAL LIGATION;  Surgeon: Hildred Laser, MD;  Location: [facility] ORS;  Service: Gynecology;  Laterality: N/A;    Prior to Admission medications   Medication Sig Start Date End Date Taking? Authorizing Provider  nitrofurantoin, macrocrystal-monohydrate, (MACROBID) 100 MG capsule Take 1 capsule (100 mg total) by mouth 2 (two) times daily. 12/24/17   Shambley, Melody N, CNM  ofloxacin (OCUFLOX) 0.3 % ophthalmic solution Place 1 drop into the right eye 4 (four) times daily. Patient not taking: Reported on 12/24/2017 10/09/17   Collene Gobble, MD    No Known Allergies  Family History  Problem Relation Age of Onset  . Cancer Paternal Grandmother        BREAST  . Heart attack Father   . Diabetes Father   . Diabetes Mother      Social History Social History   Tobacco Use  . Smoking status: Former Smoker    Packs/day: 0.25    Years: 15.00    Pack years: 3.75    Types: Cigarettes    Last attempt to quit: 09/08/2015    Years since quitting: 2.4  . Smokeless tobacco: Never Used  Substance Use Topics  . Alcohol use: No  . Drug use: No    Review of Systems Constitutional: Negative for fever. Cardiovascular: Negative for chest pain. Respiratory: Negative for shortness of breath. Gastrointestinal: Negative for abdominal pain Musculoskeletal: Mild lower extremity edema Skin: Positive for sunburn All other ROS negative  ____________________________________________   PHYSICAL EXAM:  VITAL SIGNS: ED Triage Vitals [03/03/18 2208]  Enc Vitals Group     BP 126/78     Pulse Rate 87     Resp 16     Temp 98 F (36.7 C)     Temp Source Oral     SpO2 100 %     Weight 190 lb (86.2 kg)     Height      Head Circumference      Peak Flow      Pain Score      Pain Loc      Pain Edu?      Excl. in GC?    Constitutional: Alert and oriented. Well appearing and in no distress. Eyes: Normal exam ENT  Head: Normocephalic and atraumatic   Mouth/Throat: Mucous membranes are moist. Cardiovascular: Normal rate, regular rhythm. No murmur Respiratory: Normal respiratory effort without tachypnea nor retractions. Breath sounds are clear Gastrointestinal: Soft and nontender. No distention.  Musculoskeletal: Nontender with normal range of motion in all extremities.  Mild lower extremity/pedal edema bilaterally. Neurologic:  Normal speech and language. No gross focal neurologic deficits  Skin: Patient has significant first-degree burns consistent with sunburn to her bilateral lower extremities and upper back.  Patient has mild lower extremity edema/pedal edema.  Tenderness to palpation over this area, blanching. Psychiatric: Mood and affect are normal. Speech and behavior are normal.    ____________________________________________    INITIAL IMPRESSION / ASSESSMENT AND PLAN / ED COURSE  Pertinent labs & imaging results that were available during my care of the patient were reviewed by me and considered in my medical decision making (see chart for details).  Work-up consistent with fairly significant first-degree sunburn.  Overall the patient appears very well, vitals are reassuring.  A physical exam is reassuring.  We will discharge with a short course of pain medication.  I discussed using over-the-counter aloe vera as well as ibuprofen products using cool baths and showers for the next 24 to 48 hours and elevating her feet at night.  Patient agreeable to this plan of care.  ____________________________________________   FINAL CLINICAL IMPRESSION(S) / ED DIAGNOSES  Sunburn    [name], MD 03/03/18 2306

## 2018-03-03 NOTE — ED Triage Notes (Addendum)
Sunburn to body while at the beach yesterday.  Patient feels shaky and having swelling to legs and feet.  Naproxen taken just PTA.  First degree sunburn to legs bilaterally and upper back and upper arms.

## 2018-06-11 ENCOUNTER — Encounter: Payer: Self-pay | Admitting: Emergency Medicine

## 2018-06-11 ENCOUNTER — Emergency Department
Admission: EM | Admit: 2018-06-11 | Discharge: 2018-06-11 | Disposition: A | Discharge status: Home / Self Care | Payer: Managed Care, Other (non HMO) | Attending: Emergency Medicine | Admitting: Emergency Medicine

## 2018-06-11 DIAGNOSIS — Z79899 Other long term (current) drug therapy: Secondary | ICD-10-CM | POA: Insufficient documentation

## 2018-06-11 DIAGNOSIS — Z87891 Personal history of nicotine dependence: Secondary | ICD-10-CM | POA: Diagnosis not present

## 2018-06-11 DIAGNOSIS — R55 Syncope and collapse: Secondary | ICD-10-CM | POA: Insufficient documentation

## 2018-06-11 LAB — BASIC METABOLIC PANEL
Anion gap: 7 (ref 5–15)
BUN: 10 mg/dL (ref 6–20)
CALCIUM: 8.9 mg/dL (ref 8.9–10.3)
CO2: 25 mmol/L (ref 22–32)
CREATININE: 0.66 mg/dL (ref 0.44–1.00)
Chloride: 104 mmol/L (ref 98–111)
GFR calc non Af Amer: >60 mL/min (ref >60)
Glucose, Bld: 164 mg/dL — ABNORMAL HIGH (ref 70–99)
Potassium: 3.5 mmol/L (ref 3.5–5.1)
Sodium: 136 mmol/L (ref 135–145)

## 2018-06-11 LAB — URINALYSIS, COMPLETE (UACMP) WITH MICROSCOPIC
BACTERIA UA: NONE SEEN
BILIRUBIN URINE: NEGATIVE
GLUCOSE, UA: NEGATIVE mg/dL
HGB URINE DIPSTICK: NEGATIVE
Ketones, ur: NEGATIVE mg/dL
Leukocytes, UA: NEGATIVE
Nitrite: NEGATIVE
PROTEIN: NEGATIVE mg/dL
Specific Gravity, Urine: 1.016 (ref 1.005–1.030)
pH: 5 (ref 5.0–8.0)

## 2018-06-11 LAB — CBC
HCT: 38.3 % (ref 35.0–47.0)
HEMOGLOBIN: 12.6 g/dL (ref 12.0–16.0)
MCH: 26.3 pg (ref 26.0–34.0)
MCHC: 33 g/dL (ref 32.0–36.0)
MCV: 79.8 fL — ABNORMAL LOW (ref 80.0–100.0)
PLATELETS: 217 10*3/uL (ref 150–440)
RBC: 4.8 MIL/uL (ref 3.80–5.20)
RDW: 14.8 % — ABNORMAL HIGH (ref 11.5–14.5)
WBC: 5.4 10*3/uL (ref 3.6–11.0)

## 2018-06-11 LAB — GLUCOSE, CAPILLARY
GLUCOSE-CAPILLARY: 155 mg/dL — AB (ref 70–99)
GLUCOSE-CAPILLARY: 157 mg/dL — AB (ref 70–99)

## 2018-06-11 LAB — POCT PREGNANCY, URINE: Preg Test, Ur: NEGATIVE

## 2018-06-11 MED ORDER — LORAZEPAM 1 MG PO TABS
1.0000 mg | ORAL_TABLET | Freq: Once | ORAL | Status: AC
  Administered 2018-06-11: 1 mg via ORAL
  Filled 2018-06-11: qty 1

## 2018-06-11 MED ORDER — HYDROXYZINE HCL 25 MG PO TABS: 25.0000 mg | ORAL_TABLET | Freq: Three times a day (TID) | ORAL | 0 refills | Status: DC | PRN

## 2018-06-11 NOTE — ED Notes (Signed)
FIRST NURSE NOTE:  Pt arrived via EMS with reports of anxiety, per EMS pt was jittery, anxious. Per EMS pt reports she may be pregnant has her period is 8 days late.

## 2018-06-11 NOTE — ED Provider Notes (Signed)
[facility] Emergency Department Provider Note  Time seen: 7:32 PM  I have reviewed the triage vital signs and the nursing notes.   HISTORY  Chief Complaint Dizziness    HPI ROSHANNA Cooper is a 37 y.o. female with a past medical history of gastric reflux presents to the emergency department for dizziness and jitteriness.  According to the patient she was at work when around 5 PM she began feeling unwell.  Patient states she was feeling jittery and somewhat anxious became tearful felt like she was having some trouble breathing felt like she might pass out.  States a coworker got her something to eat and drink and she was feeling better however states shortly after that she once again felt very jittery and became concerned so she called EMS.  EMS arrived and brought the patient to the emergency department.  EMS reports normal vitals.  Patient states that times it feels like she cannot take a full deep breath of air and then the symptoms will go away and she can breathe normally.  While talking to the patient she had an episode where she became tearful and felt like she could not catch a deep breath of air and then the symptoms passed and she felt normal once again.  Patient denies any history of significant anxiety in the past.  Denies any chest pain at any point.  No pleuritic pain.  No abdominal pain, nausea vomiting or diarrhea.  No dysuria or hematuria.  Largely negative review of systems.   Past Medical History:  Diagnosis Date  . GERD (gastroesophageal reflux disease)     Patient Active Problem List   Diagnosis Date Noted  . 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 DILATION AND CURETTAGE OF UTERUS  2006  . MOUTH SURGERY    . TUBAL LIGATION N/A 05/03/2016   Procedure: POST PARTUM TUBAL LIGATION;  Surgeon: Hildred Laser, MD;  Location: [facility] ORS;  Service: Gynecology;  Laterality: N/A;    Prior to Admission  medications   Medication Sig Start Date End Date Taking? Authorizing Provider  HYDROcodone-acetaminophen (NORCO/VICODIN) 5-325 MG tablet Take 1 tablet by mouth every 4 (four) hours as needed. 03/03/18   [name], MD  nitrofurantoin, macrocrystal-monohydrate, (MACROBID) 100 MG capsule Take 1 capsule (100 mg total) by mouth 2 (two) times daily. 12/24/17   Shambley, Melody N, CNM  ofloxacin (OCUFLOX) 0.3 % ophthalmic solution Place 1 drop into the right eye 4 (four) times daily. Patient not taking: Reported on 12/24/2017 10/09/17   Collene Gobble, MD    No Known Allergies  Family History  Problem Relation Age of Onset  . Cancer Paternal Grandmother        BREAST  . Heart attack Father   . Diabetes Father   . Diabetes Mother     Social History Social History   Tobacco Use  . Smoking status: Former Smoker    Packs/day: 0.25    Years: 15.00    Pack years: 3.75    Types: Cigarettes    Last attempt to quit: 09/08/2015    Years since quitting: 2.7  . Smokeless tobacco: Never Used  Substance Use Topics  . Alcohol use: No  . Drug use: No    Review of Systems Constitutional: Negative for fever. Cardiovascular: Negative for chest pain. Respiratory: Denies feeling short of breath but states when the episodes occur it feels like she cannot catch a big deep breath  of air. Gastrointestinal: Negative for abdominal pain, vomiting  Genitourinary: Negative for urinary compaints Musculoskeletal: Negative for musculoskeletal complaints Skin: Negative for skin complaints  Neurological: Negative for headache All other ROS negative  ____________________________________________   PHYSICAL EXAM:  VITAL SIGNS: ED Triage Vitals  Enc Vitals Group     BP 06/11/18 1819 127/74     Pulse Rate 06/11/18 1819 87     Resp 06/11/18 1819 (!) 22     Temp 06/11/18 1819 98 F (36.7 C)     Temp Source 06/11/18 1819 Oral     SpO2 06/11/18 1819 100 %     Weight 06/11/18 1821 190 lb (86.2 kg)      Height 06/11/18 1821 5\' 4"  (1.626 m)     Head Circumference --      Peak Flow --      Pain Score 06/11/18 1919 0     Pain Loc --      Pain Edu? --      Excl. in GC? --     Constitutional: Alert and oriented. Well appearing and in no distress. Eyes: Normal exam ENT   Head: Normocephalic and atraumatic.   Mouth/Throat: Mucous membranes are moist. Cardiovascular: Normal rate, regular rhythm. No murmur Respiratory: Normal respiratory effort without tachypnea nor retractions. Breath sounds are clear  Gastrointestinal: Soft and nontender. No distention.  Musculoskeletal: Nontender with normal range of motion in all extremities.  Neurologic:  Normal speech and language. No gross focal neurologic deficits Skin:  Skin is warm, dry and intact.  Psychiatric: Mood and affect are normal.   ____________________________________________    EKG  EKG reviewed and interpreted by myself shows normal sinus rhythm at 69 bpm with a narrow QRS, normal axis, normal intervals, no ST changes.  ____________________________________________   INITIAL IMPRESSION / ASSESSMENT AND PLAN / ED COURSE  Pertinent labs & imaging results that were available during my care of the patient were reviewed by me and considered in my medical decision making (see chart for details).  Patient presents to the emergency department for symptoms beginning around 5 PM and when she states she feels somewhat dizzy/lightheaded feels like she cannot catch a deep breath of air becomes tearful and jittery.  Symptoms last for several minutes and then resolved and the patient is back to normal.  Patient had an episode during my evaluation which she became very tearful.  Differential would include anxiety, metabolic abnormality, electrolyte abnormality.  Overall the patient appears very well, vitals are largely normal.  Patient's labs are resulted largely within normal limits including negative pregnancy test.  We will treat with  Ativan orally and continue to closely monitor.  Overall the patient appears very well.  Symptoms are very suggestive of anxiety but the patient denies any past history of anxiety making it somewhat abnormal.  Patient denies any chest pain at any point.  Denies any pleuritic pain.    EKG is normal.  Patient states she is feeling better after Ativan having any further episodes at this time.  We will discharge home I did discuss a trial of hydroxyzine use only if needed, plenty fluids and plenty of rest.  Patient agreeable plan of care also discussed return precautions for any chest pain or development of shortness of breath.  ____________________________________________   FINAL CLINICAL IMPRESSION(S) / ED DIAGNOSES  Near syncope    [name], MD 06/11/18 2023

## 2018-06-11 NOTE — ED Triage Notes (Signed)
Pt arrived via ems from work with complaints of dizziness, lips tingling, and "jittering." Pt tearful and anxious in triage. Pt reports being under a lot of stress lately. Pt denies any chest pain.

## 2018-06-11 NOTE — ED Notes (Signed)
Pt states she felt lightheaded and unsteady on feet earlier. Pt states she is not having any pain but lips were numb and tingling earlier, pt states she did believe she was breathing fast at the time she was feeling this. RN discussed hyperventilation with the patient and gave pt instructions to slow breathing when she is feeling anxious and has increased respirations.

## 2018-06-20 ENCOUNTER — Other Ambulatory Visit: Payer: Self-pay

## 2018-06-20 ENCOUNTER — Emergency Department
Admission: EM | Admit: 2018-06-20 | Discharge: 2018-06-20 | Disposition: A | Discharge status: Home / Self Care | Payer: Managed Care, Other (non HMO) | Attending: Emergency Medicine | Admitting: Emergency Medicine

## 2018-06-20 ENCOUNTER — Encounter: Payer: Self-pay | Admitting: Emergency Medicine

## 2018-06-20 DIAGNOSIS — Z87891 Personal history of nicotine dependence: Secondary | ICD-10-CM | POA: Diagnosis not present

## 2018-06-20 DIAGNOSIS — M79602 Pain in left arm: Secondary | ICD-10-CM | POA: Insufficient documentation

## 2018-06-20 DIAGNOSIS — Z79899 Other long term (current) drug therapy: Secondary | ICD-10-CM | POA: Insufficient documentation

## 2018-06-20 DIAGNOSIS — M7918 Myalgia, other site: Secondary | ICD-10-CM | POA: Diagnosis not present

## 2018-06-20 DIAGNOSIS — M79601 Pain in right arm: Secondary | ICD-10-CM | POA: Diagnosis present

## 2018-06-20 LAB — BASIC METABOLIC PANEL
Anion gap: 5 (ref 5–15)
BUN: 12 mg/dL (ref 6–20)
CHLORIDE: 106 mmol/L (ref 98–111)
CO2: 26 mmol/L (ref 22–32)
Calcium: 9.2 mg/dL (ref 8.9–10.3)
Creatinine, Ser: 0.64 mg/dL (ref 0.44–1.00)
GFR calc Af Amer: >60 mL/min (ref >60)
GLUCOSE: 114 mg/dL — AB (ref 70–99)
POTASSIUM: 3.8 mmol/L (ref 3.5–5.1)
Sodium: 137 mmol/L (ref 135–145)

## 2018-06-20 LAB — CBC
HEMATOCRIT: 37.6 % (ref 35.0–47.0)
HEMOGLOBIN: 12.5 g/dL (ref 12.0–16.0)
MCH: 26.7 pg (ref 26.0–34.0)
MCHC: 33.3 g/dL (ref 32.0–36.0)
MCV: 80.2 fL (ref 80.0–100.0)
PLATELETS: 239 10*3/uL (ref 150–440)
RBC: 4.68 MIL/uL (ref 3.80–5.20)
RDW: 15.3 % — ABNORMAL HIGH (ref 11.5–14.5)
WBC: 6.5 10*3/uL (ref 3.6–11.0)

## 2018-06-20 LAB — CK: Total CK: 58 U/L (ref 38–234)

## 2018-06-20 NOTE — ED Provider Notes (Signed)
[facility] Emergency Department Provider Note  ____________________________________________  Time seen: Approximately 9:09 PM  I have reviewed the triage vital signs and the nursing notes.   HISTORY  Chief Complaint Arm Pain   HPI Mary Cooper is a 37 y.o. female with history of GERD and anxiety who presents for evaluation of pain in her bilateral arms.  Patient reports that 3 days ago she was started on fluoxetine for anxiety.  She reports that this morning she woke up with a dull ache on her bilateral arms that starts at the elbow and goes all the way to the hands.  Ache has been mild but constant all day.  Patient try to call her doctor twice to see if these were side effects of the medication but was unable to get a hold of them.  Patient was concerned that this was a potential bad side effect and came to the emergency room for evaluation.  She has no numbness or weakness in her arms, symptoms do not spread to the rest of her body, she has no chest pain or shortness of breath.  Patient reports that she types a lot at work and is not sure if this could be related to that.  Past Medical History:  Diagnosis Date  . GERD (gastroesophageal reflux disease)     Patient Active Problem List   Diagnosis Date Noted  . 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 DILATION AND CURETTAGE OF UTERUS  2006  . MOUTH SURGERY    . TUBAL LIGATION N/A 05/03/2016   Procedure: POST PARTUM TUBAL LIGATION;  Surgeon: Hildred Laser, MD;  Location: [facility] ORS;  Service: Gynecology;  Laterality: N/A;    Prior to Admission medications   Medication Sig Start Date End Date Taking? Authorizing Provider  HYDROcodone-acetaminophen (NORCO/VICODIN) 5-325 MG tablet Take 1 tablet by mouth every 4 (four) hours as needed. 03/03/18   [name], MD  hydrOXYzine (ATARAX/VISTARIL) 25 MG tablet Take 1 tablet (25 mg total) by mouth 3 (three) times  daily as needed for anxiety. 06/11/18   [name], MD  nitrofurantoin, macrocrystal-monohydrate, (MACROBID) 100 MG capsule Take 1 capsule (100 mg total) by mouth 2 (two) times daily. 12/24/17   Shambley, Melody N, CNM  ofloxacin (OCUFLOX) 0.3 % ophthalmic solution Place 1 drop into the right eye 4 (four) times daily. Patient not taking: Reported on 12/24/2017 10/09/17   Collene Gobble, MD    Allergies Patient has no known allergies.  Family History  Problem Relation Age of Onset  . Cancer Paternal Grandmother        BREAST  . Heart attack Father   . Diabetes Father   . Diabetes Mother     Social History Social History   Tobacco Use  . Smoking status: Former Smoker    Packs/day: 0.25    Years: 15.00    Pack years: 3.75    Types: Cigarettes    Last attempt to quit: 09/08/2015    Years since quitting: 2.7  . Smokeless tobacco: Never Used  Substance Use Topics  . Alcohol use: No  . Drug use: No    Review of Systems  Constitutional: Negative for fever. Eyes: Negative for visual changes. ENT: Negative for sore throat. Neck: No neck pain  Cardiovascular: Negative for chest pain. Respiratory: Negative for shortness of breath. Gastrointestinal: Negative for abdominal pain, vomiting or diarrhea. Genitourinary: Negative for dysuria. Musculoskeletal: Negative for back pain. +  Aching pain of bilateral arms Skin: Negative for rash. Neurological: Negative for headaches, weakness or numbness. Psych: No SI or HI  ____________________________________________   PHYSICAL EXAM:  VITAL SIGNS: ED Triage Vitals  Enc Vitals Group     BP 06/20/18 1836 140/86     Pulse Rate 06/20/18 1836 91     Resp 06/20/18 1836 20     Temp 06/20/18 1836 98.3 F (36.8 C)     Temp Source 06/20/18 1836 Oral     SpO2 06/20/18 1836 99 %     Weight 06/20/18 1837 198 lb (89.8 kg)     Height 06/20/18 1837 5\' 4"  (1.626 m)     Head Circumference --      Peak Flow --      Pain Score 06/20/18 1837 3      Pain Loc --      Pain Edu? --      Excl. in GC? --     Constitutional: Alert and oriented. Well appearing and in no apparent distress. HEENT:      Head: Normocephalic and atraumatic.         Eyes: Conjunctivae are normal. Sclera is non-icteric.       Mouth/Throat: Mucous membranes are moist.       Neck: Supple with no signs of meningismus. Cardiovascular: Regular rate and rhythm. No murmurs, gallops, or rubs. 2+ symmetrical distal pulses are present in all extremities. No JVD. Respiratory: Normal respiratory effort. Lungs are clear to auscultation bilaterally. No wheezes, crackles, or rhonchi.  Gastrointestinal: Soft, non tender, and non distended with positive bowel sounds. No rebound or guarding. Musculoskeletal: Nontender with normal range of motion in all extremities. No edema, cyanosis, or erythema of extremities.  No tenderness with palpation Neurologic: Normal speech and language. Face is symmetric. Moving all extremities. No gross focal neurologic deficits are appreciated. Skin: Skin is warm, dry and intact. No rash noted. Psychiatric: Mood and affect are normal. Speech and behavior are normal.  ____________________________________________   LABS (all labs ordered are listed, but only abnormal results are displayed)  Labs Reviewed  CBC - Abnormal; Notable for the following components:      Result Value   RDW 15.3 (*)    All other components within normal limits  BASIC METABOLIC PANEL - Abnormal; Notable for the following components:   Glucose, Bld 114 (*)    All other components within normal limits  CK   ____________________________________________  EKG  none  ____________________________________________  RADIOLOGY  none  ____________________________________________   PROCEDURES  Procedure(s) performed: None Procedures Critical Care performed:  None ____________________________________________   INITIAL IMPRESSION / ASSESSMENT AND PLAN / ED  COURSE   37 y.o. female with history of GERD and anxiety who presents for evaluation of pain in her bilateral arms.  Symptoms started today after being 3 days on fluoxetine and patient is concerned that this may be a side effect.  I do not see rhabdo as a potential side effect of fluoxetine but a total CK was done and that is negative.  Patient has no tenderness to palpation of her arms, no signs of ischemia, no signs of cellulitis, she has normal strength and sensation, normal strong pulses bilaterally.  I explained to the patient that this could be soreness from her work but it could also be a side effect from fluoxetine.  Recommend she continues to take fluoxetine but if the symptoms are getting worse for her to stop otherwise to call her doctor Monday to  discuss any possible alternatives.  Discussed return precautions if the symptoms are spreading to the rest of the body, she has any chest or back pain, any shortness of breath.      As part of my medical decision making, I reviewed the following data within the electronic MEDICAL RECORD NUMBER Nursing notes reviewed and incorporated, Labs reviewed , Old chart reviewed, Notes from prior ED visits and [place] Controlled Substance Database    Pertinent labs & imaging results that were available during my care of the patient were reviewed by me and considered in my medical decision making (see chart for details).    ____________________________________________   FINAL CLINICAL IMPRESSION(S) / ED DIAGNOSES  Final diagnoses:  Muscle ache of extremity      NEW MEDICATIONS STARTED DURING THIS VISIT:  ED Discharge Orders    None       Note:  This document was prepared using Dragon voice recognition software and may include unintentional dictation errors.    Nita Sickle, MD 06/20/18 2128

## 2018-06-20 NOTE — ED Triage Notes (Signed)
States has had bilateral hand and forearm aches since beginning taking fluoxetine 3 days ago.

## 2018-06-20 NOTE — ED Notes (Signed)
ED Provider at bedside. 

## 2018-06-20 NOTE — Discharge Instructions (Signed)
Continue your medications. If your symptoms are getting worse stop Prozac. Follow up with your doctor on Monday. Return to the ER for chest pain, shortness of breath or any new symptoms concerning to you.

## 2018-06-20 NOTE — ED Notes (Signed)
Patient states that she was seen here last week for a panic attack. Patient states that she followed up with her PCP this week and was started on Prozac. Patient states last night her bilateral lower arms started feeling achy. Patient states that she is unsure if it is related to anxiety or if it is a reaction to the new medication.

## 2018-08-12 ENCOUNTER — Ambulatory Visit: Payer: Self-pay | Admitting: Emergency Medicine

## 2018-08-12 VITALS — BP 122/64 | HR 98 | Temp 98.3°F | Resp 14 | Ht 64.0 in | Wt 198.3 lb

## 2018-08-12 DIAGNOSIS — J0101 Acute recurrent maxillary sinusitis: Secondary | ICD-10-CM

## 2018-08-12 MED ORDER — AMOXICILLIN 875 MG PO TABS: 875.0000 mg | ORAL_TABLET | Freq: Two times a day (BID) | ORAL | 0 refills | Status: DC

## 2018-08-12 NOTE — Patient Instructions (Signed)
Take antibiotics as instructed. Use saline nasal spray. Resume Flonase. Follow-up with your primary care physician if persistent symptoms.   Sinusitis, Adult Sinusitis is soreness and inflammation of your sinuses. Sinuses are hollow spaces in the bones around your face. They are located:  Around your eyes.  In the middle of your forehead.  Behind your nose.  In your cheekbones.  Your sinuses and nasal passages are lined with a stringy fluid (mucus). Mucus normally drains out of your sinuses. When your nasal tissues get inflamed or swollen, the mucus can get trapped or blocked so air cannot flow through your sinuses. This lets bacteria, viruses, and funguses grow, and that leads to infection. Follow these instructions at home: Medicines  Take, use, or apply over-the-counter and prescription medicines only as told by your doctor. These may include nasal sprays.  If you were prescribed an antibiotic medicine, take it as told by your doctor. Do not stop taking the antibiotic even if you start to feel better. Hydrate and Humidify  Drink enough water to keep your pee (urine) clear or pale yellow.  Use a cool mist humidifier to keep the humidity level in your home above 50%.  Breathe in steam for 10-15 minutes, 3-4 times a day or as told by your doctor. You can do this in the bathroom while a hot shower is running.  Try not to spend time in cool or dry air. Rest  Rest as much as possible.  Sleep with your head raised (elevated).  Make sure to get enough sleep each night. General instructions  Put a warm, moist washcloth on your face 3-4 times a day or as told by your doctor. This will help with discomfort.  Wash your hands often with soap and water. If there is no soap and water, use hand sanitizer.  Do not smoke. Avoid being around people who are smoking (secondhand smoke).  Keep all follow-up visits as told by your doctor. This is important. Contact a doctor if:  You  have a fever.  Your symptoms get worse.  Your symptoms do not get better within 10 days. Get help right away if:  You have a very bad headache.  You cannot stop throwing up (vomiting).  You have pain or swelling around your face or eyes.  You have trouble seeing.  You feel confused.  Your neck is stiff.  You have trouble breathing. This information is not intended to replace advice given to you by your health care provider. Make sure you discuss any questions you have with your health care provider. Document Released: 03/12/2008 Document Revised: 05/20/2016 Document Reviewed: 07/20/2015 Elsevier Interactive Patient Education  [employer].

## 2018-08-12 NOTE — Addendum Note (Signed)
Addended by: Karen Kays on: 08/12/2018 10:41 AM   Modules accepted: Level of Service

## 2018-08-12 NOTE — Progress Notes (Signed)
Subjective. Patient was in her usual state of health until Saturday morning when she developed a sore throat.  This resolved through the day and she was left with significant sinus congestion as well as postnasal drip.  Over the last 36 hours she has developed significant left facial pain and tenderness in the nodes on the right side of her neck.  She has had a mild cough.  She does have a history of recurrent sinus infections. Review of systems. Currently undergoing adjustments to her dosage of SSRI.  She is under treatment for panic disorder.  She is currently on Zoloft 50 mg and titrating up currently at 1/2 tablet a day. Objective. Alert and cooperative not in any distress. Conjunctive are clear. TMs are clear there is fine whitish grains in the external auditory canals bilaterally. Nose.  There is redness and turbinate congestion. Sinuses.  There is significant tenderness over the left maxillary sinus. Neck.  1 x 1 cm right upper anterior cervical node. Chest clear to auscultation. Heart regular rate and rhythm. Skin warm and dry without rash. Assessment. Probable recent upper respiratory infection with developing left maxillary sinusitis. Plan. Amoxicillin 875 twice daily. Resume Flonase. Saline nasal spray. Follow-up with PCP if persistent problems.

## 2018-10-28 ENCOUNTER — Ambulatory Visit: Payer: Medicaid Other | Admitting: Adult Health

## 2018-10-28 VITALS — BP 122/78 | HR 90 | Temp 98.3°F | Resp 14

## 2018-10-28 DIAGNOSIS — R300 Dysuria: Secondary | ICD-10-CM

## 2018-10-28 DIAGNOSIS — N3000 Acute cystitis without hematuria: Secondary | ICD-10-CM

## 2018-10-28 DIAGNOSIS — R197 Diarrhea, unspecified: Secondary | ICD-10-CM

## 2018-10-28 LAB — POCT URINALYSIS DIPSTICK
BILIRUBIN UA: NEGATIVE
Glucose, UA: NEGATIVE
Leukocytes, UA: NEGATIVE
Nitrite, UA: NEGATIVE
Protein, UA: POSITIVE — AB
Spec Grav, UA: 1.025 (ref 1.010–1.025)
Urobilinogen, UA: 0.2 E.U./dL
pH, UA: 5.5 (ref 5.0–8.0)

## 2018-10-28 MED ORDER — SULFAMETHOXAZOLE-TRIMETHOPRIM 800-160 MG PO TABS: 1.0000 | ORAL_TABLET | Freq: Two times a day (BID) | ORAL | 0 refills | Status: AC

## 2018-10-28 NOTE — Progress Notes (Signed)
Subjective:     Patient ID: Mary Cooper, female   DOB: 27-May-1981, 38 y.o.   MRN: [id number]  Blood pressure 122/78, pulse 90, temperature 98.3 F (36.8 C), temperature source Oral, resp. rate 14, last menstrual period 10/05/2018, SpO2 97 %. Dysuria   This is a new problem. The current episode started yesterday (she has increased hydration since yesterday and feels better. ). The problem occurs intermittently. The problem has been unchanged. The quality of the pain is described as burning. The pain is at a severity of 0/10. The patient is experiencing no pain. There has been no fever. She is sexually active (10/01/2018. ). Pertinent negatives include no chills, discharge, flank pain, frequency, hematuria, hesitancy, nausea, possible pregnancy, sweats, urgency or vomiting. She has tried increased fluids for the symptoms. The treatment provided mild relief. There is no history of catheterization, kidney stones, recurrent UTIs, a single kidney, urinary stasis or a urological procedure.  Diarrhea   This is a new problem. The current episode started in the past 7 days (3 days ago . Sh ehas had kids at home small and have had diarrhea and vomiting. ). The problem occurs less than 2 times per day (none today ). The problem has been gradually improving. The patient states that diarrhea does not awaken her from sleep. Pertinent negatives include no abdominal pain, arthralgias, bloating, chills, coughing, fever, headaches, increased  flatus, myalgias, sweats, URI, vomiting or weight loss. Nothing aggravates the symptoms. There are no known risk factors. She has tried increased fluids for the symptoms. There is no history of bowel resection, inflammatory bowel disease, irritable bowel syndrome, malabsorption, a recent abdominal surgery or short gut syndrome.     Review of Systems  Constitutional: Negative for activity change, appetite change, chills, diaphoresis, fatigue, fever, unexpected weight change and  weight loss.  HENT: Negative.   Eyes: Negative.   Respiratory: Negative.  Negative for apnea, cough, choking, chest tightness, shortness of breath, wheezing and stridor.   Cardiovascular: Negative.   Gastrointestinal: Positive for diarrhea (resolved today ). Negative for abdominal distention, abdominal pain, anal bleeding, bloating, blood in stool, constipation, flatus, nausea and vomiting.  Endocrine: Negative.   Genitourinary: Positive for dysuria. Negative for decreased urine volume, difficulty urinating, dyspareunia, enuresis, flank pain, frequency, genital sores, hematuria, hesitancy, menstrual problem, pelvic pain, urgency, vaginal bleeding, vaginal discharge and vaginal pain.  Musculoskeletal: Negative for arthralgias and myalgias.  Skin: Negative.   Neurological: Negative for headaches.  Psychiatric/Behavioral: Negative.        Objective:   Physical Exam Vitals signs reviewed.  Constitutional:      Appearance: Normal appearance. She is normal weight.  HENT:     Head: Normocephalic and atraumatic.     Mouth/Throat:     Mouth: Mucous membranes are moist.     Pharynx: No oropharyngeal exudate or posterior oropharyngeal erythema.  Eyes:     Extraocular Movements: Extraocular movements intact.     Pupils: Pupils are equal, round, and reactive to light.  Neck:     Musculoskeletal: Full passive range of motion without pain, normal range of motion and neck supple.     Trachea: Phonation normal.  Cardiovascular:     Rate and Rhythm: Normal rate and regular rhythm.  Pulmonary:     Effort: Pulmonary effort is normal. No respiratory distress.     Breath sounds: Normal breath sounds. No stridor. No wheezing, rhonchi or rales.  Chest:     Chest wall: No tenderness.  Abdominal:  General: Bowel sounds are normal. There is no distension.     Palpations: Abdomen is soft. There is no mass.     Tenderness: There is no abdominal tenderness. There is no right CVA tenderness, left CVA  tenderness, guarding or rebound.     Hernia: No hernia is present.  Musculoskeletal: Normal range of motion.  Skin:    General: Skin is warm.     Capillary Refill: Capillary refill takes less than 2 seconds.  Neurological:     General: No focal deficit present.     Mental Status: She is alert and oriented to person, place, and time.  Psychiatric:        Mood and Affect: Mood normal.        Behavior: Behavior normal.        Thought Content: Thought content normal.        Judgment: Judgment normal.        Assessment:     Dysuria - Plan: POCT Urinalysis Dipstick (CPT 81002), Urine Culture  Acute cystitis without hematuria  Diarrhea, unspecified type- resolved   Results for orders placed or performed in visit on 10/28/18 (from the past 24 hour(s))  POCT Urinalysis Dipstick (CPT 81002)     Status: Abnormal   Collection Time: 10/28/18 10:57 AM  Result Value Ref Range   Color, UA Yellow    Clarity, UA Clear    Glucose, UA Negative Negative   Bilirubin, UA Negative    Ketones, UA Trace    Spec Grav, UA 1.025 1.010 - 1.025   Blood, UA Trace-intact    pH, UA 5.5 5.0 - 8.0   Protein, UA Positive (A) Negative   Urobilinogen, UA 0.2 0.2 or 1.0 E.U./dL   Nitrite, UA Negative    Leukocytes, UA Negative Negative   Appearance     Odor        Plan:     Meds ordered this encounter  Medications  . sulfamethoxazole-trimethoprim (BACTRIM DS,SEPTRA DS) 800-160 MG tablet    Sig: Take 1 tablet by mouth 2 (two) times daily for 7 days.    Dispense:  14 tablet    Refill:  0    Discussed possible dehydration and urinalysis results. Will treat symptoms.  Recommend follow up appointment 1 week after antibiotics completed for follow up urine dipstick.  Hematuria - discussed possible differentials- she feels as if she is getting ready to start menses as well - either way recommend follow up as above.  Will return if any symptoms worsen.   Advised patient call the office or your primary  care doctor for an appointment if no improvement within 72 hours or if any symptoms change or worsen at any time  Advised ER or urgent Care if after hours or on weekend. Call 911 for emergency symptoms at any time.Patinet verbalized understanding of all instructions given/reviewed and treatment plan and has no further questions or concerns at this time.

## 2018-10-28 NOTE — Patient Instructions (Addendum)
Urinary Tract Infection, Adult A urinary tract infection (UTI) is an infection of any part of the urinary tract. The urinary tract includes:  The kidneys.  The ureters.  The bladder.  The urethra. These organs make, store, and get rid of pee (urine) in the body. What are the causes? This is caused by germs (bacteria) in your genital area. These germs grow and cause swelling (inflammation) of your urinary tract. What increases the risk? You are more likely to develop this condition if:  You have a small, thin tube (catheter) to drain pee.  You cannot control when you pee or poop (incontinence).  You are female, and: ? You use these methods to prevent pregnancy: ? A medicine that kills sperm (spermicide). ? A device that blocks sperm (diaphragm). ? You have low levels of a female hormone (estrogen). ? You are pregnant.  You have genes that add to your risk.  You are sexually active.  You take antibiotic medicines.  You have trouble peeing because of: ? A prostate that is bigger than normal, if you are female. ? A blockage in the part of your body that drains pee from the bladder (urethra). ? A kidney stone. ? A nerve condition that affects your bladder (neurogenic bladder). ? Not getting enough to drink. ? Not peeing often enough.  You have other conditions, such as: ? Diabetes. ? A weak disease-fighting system (immune system). ? Sickle cell disease. ? Gout. ? Injury of the spine. What are the signs or symptoms? Symptoms of this condition include:  Needing to pee right away (urgently).  Peeing often.  Peeing small amounts often.  Pain or burning when peeing.  Blood in the pee.  Pee that smells bad or not like normal.  Trouble peeing.  Pee that is cloudy.  Fluid coming from the vagina, if you are female.  Pain in the belly or lower back. Other symptoms include:  Throwing up (vomiting).  No urge to eat.  Feeling mixed up (confused).  Being tired  and grouchy (irritable).  A fever.  Watery poop (diarrhea). How is this treated? This condition may be treated with:  Antibiotic medicine.  Other medicines.  Drinking enough water. Follow these instructions at home:  Medicines  Take over-the-counter and prescription medicines only as told by your doctor.  If you were prescribed an antibiotic medicine, take it as told by your doctor. Do not stop taking it even if you start to feel better. General instructions  Make sure you: ? Pee until your bladder is empty. ? Do not hold pee for a long time. ? Empty your bladder after sex. ? Wipe from front to back after pooping if you are a female. Use each tissue one time when you wipe.  Drink enough fluid to keep your pee pale yellow.  Keep all follow-up visits as told by your doctor. This is important. Contact a doctor if:  You do not get better after 1-2 days.  Your symptoms go away and then come back. Get help right away if:  You have very bad back pain.  You have very bad pain in your lower belly.  You have a fever.  You are sick to your stomach (nauseous).  You are throwing up. Summary  A urinary tract infection (UTI) is an infection of any part of the urinary tract.  This condition is caused by germs in your genital area.  There are many risk factors for a UTI. These include having a small, thin   tube to drain pee and not being able to control when you pee or poop.  Treatment includes antibiotic medicines for germs.  Drink enough fluid to keep your pee pale yellow. This information is not intended to replace advice given to you by your health care provider. Make sure you discuss any questions you have with your health care provider. Document Released: 03/12/2008 Document Revised: 04/03/2018 Document Reviewed: 04/03/2018 Elsevier Interactive Patient Education  2019 [employer]  Hematuria, Adult Hematuria is blood in the urine. Blood may be visible in the  urine, or it may be identified with a test. This condition can be caused by infections of the bladder, urethra, kidney, or prostate. Other possible causes include:  Kidney stones.  Cancer of the urinary tract.  Too much calcium in the urine.  Conditions that are passed from parent to child (inherited conditions).  Exercise that requires a lot of energy. Infections can usually be treated with medicine, and a kidney stone usually will pass through your urine. If neither of these is the cause of your hematuria, more tests may be needed to identify the cause of your symptoms. It is very important to tell your health care provider about any blood in your urine, even if it is painless or the blood stops without treatment. Blood in the urine, when it happens and then stops and then happens again, can be a symptom of a very serious condition, including cancer. There is no pain in the initial stages of many urinary cancers. Follow these instructions at home: Medicines  Take over-the-counter and prescription medicines only as told by your health care provider.  If you were prescribed an antibiotic medicine, take it as told by your health care provider. Do not stop taking the antibiotic even if you start to feel better. Eating and drinking  Drink enough fluid to keep your urine clear or pale yellow. It is recommended that you drink 3-4 quarts (2.8-3.8 L) a day. If you have been diagnosed with an infection, it is recommended that you drink cranberry juice in addition to large amounts of water.  Avoid caffeine, tea, and carbonated beverages. These tend to irritate the bladder.  Avoid alcohol because it may irritate the prostate (men). General instructions  If you have been diagnosed with a kidney stone, follow your health care provider's instructions about straining your urine to catch the stone.  Empty your bladder often. Avoid holding urine for long periods of time.  If you are female: ? After  a bowel movement, wipe from front to back and use each piece of toilet paper only once. ? Empty your bladder before and after sex.  Pay attention to any changes in your symptoms. Tell your health care provider about any changes or any new symptoms.  It is your responsibility to get your test results. Ask your health care provider, or the department performing the test, when your results will be ready.  Keep all follow-up visits as told by your health care provider. This is important. Contact a health care provider if:  You develop back pain.  You have a fever.  You have nausea or vomiting.  Your symptoms do not improve after 3 days.  Your symptoms get worse. Get help right away if:  You develop severe vomiting and are unable take medicine without vomiting.  You develop severe pain in your back or abdomen even though you are taking medicine.  You pass a large amount of blood in your urine.  You pass  blood clots in your urine.  You feel very weak or like you might faint.  You faint. Summary  Hematuria is blood in the urine. It has many possible causes.  It is very important that you tell your health care provider about any blood in your urine, even if it is painless or the blood stops without treatment.  Take over-the-counter and prescription medicines only as told by your health care provider.  Drink enough fluid to keep your urine clear or pale yellow. This information is not intended to replace advice given to you by your health care provider. Make sure you discuss any questions you have with your health care provider. Document Released: 09/24/2005 Document Revised: 10/27/2016 Document Reviewed: 10/27/2016 Elsevier Interactive Patient Education  2019 [employer].

## 2018-10-30 ENCOUNTER — Telehealth: Payer: Self-pay

## 2018-10-30 LAB — URINE CULTURE

## 2018-10-30 NOTE — Telephone Encounter (Signed)
Contacted patient for her Negative Urine Culture results. She stated that she id feeling much better since her visit. She was advised if she has any additional questions or concerns to contact the clinic or to follow up with her PCP for additional testing. The patient gave Verbal Understanding.

## 2018-11-25 ENCOUNTER — Encounter: Payer: Self-pay | Admitting: *Deleted

## 2018-11-25 ENCOUNTER — Telehealth: Payer: Self-pay | Admitting: Obstetrics and Gynecology

## 2018-11-25 NOTE — Telephone Encounter (Signed)
The patient states she has been having back pain near her left kidney that radiates around to her front x1 day, and she has made an appointment for tomorrow morning at 9:15 but wanted to see if she needed to come sooner or do a urine drop off today?  She is asking if her nurse can call her today, please advise, thanks.

## 2018-11-25 NOTE — Telephone Encounter (Signed)
Sent pt mychart message

## 2018-11-26 ENCOUNTER — Encounter: Payer: Self-pay | Admitting: Obstetrics and Gynecology

## 2018-11-26 ENCOUNTER — Ambulatory Visit (INDEPENDENT_AMBULATORY_CARE_PROVIDER_SITE_OTHER): Payer: Managed Care, Other (non HMO) | Admitting: Obstetrics and Gynecology

## 2018-11-26 VITALS — BP 120/75 | HR 75 | Ht 64.0 in | Wt 213.9 lb

## 2018-11-26 DIAGNOSIS — M545 Low back pain, unspecified: Secondary | ICD-10-CM

## 2018-11-26 DIAGNOSIS — N83202 Unspecified ovarian cyst, left side: Secondary | ICD-10-CM | POA: Diagnosis not present

## 2018-11-26 DIAGNOSIS — R102 Pelvic and perineal pain: Secondary | ICD-10-CM

## 2018-11-26 MED ORDER — IBUPROFEN 800 MG PO TABS: 800.0000 mg | ORAL_TABLET | Freq: Three times a day (TID) | ORAL | 1 refills | Status: DC | PRN

## 2018-11-26 NOTE — Patient Instructions (Signed)
Ovarian Cyst         An ovarian cyst is a fluid-filled sac that forms on an ovary. The ovaries are small organs that produce eggs in women. Various types of cysts can form on the ovaries. Some may cause symptoms and require treatment. Most ovarian cysts go away on their own, are not cancerous (are benign), and do not cause problems.  Common types of ovarian cysts include:  · Functional (follicle) cysts.  ? Occur during the menstrual cycle, and usually go away with the next menstrual cycle if you do not get pregnant.  ? Usually cause no symptoms.  · Endometriomas.  ? Are cysts that form from the tissue that lines the uterus (endometrium).  ? Are sometimes called “chocolate cysts” because they become filled with blood that turns brown.  ? Can cause pain in the lower abdomen during intercourse and during your period.  · Cystadenoma cysts.  ? Develop from cells on the outside surface of the ovary.  ? Can get very large and cause lower abdomen pain and pain with intercourse.  ? Can cause severe pain if they twist or break open (rupture).  · Dermoid cysts.  ? Are sometimes found in both ovaries.  ? May contain different kinds of body tissue, such as skin, teeth, hair, or cartilage.  ? Usually do not cause symptoms unless they get very big.  · Theca lutein cysts.  ? Occur when too much of a certain hormone (human chorionic gonadotropin) is produced and overstimulates the ovaries to produce an egg.  ? Are most common after having procedures used to assist with the conception of a baby (in vitro fertilization).  What are the causes?  Ovarian cysts may be caused by:  · Ovarian hyperstimulation syndrome. This is a condition that can develop from taking fertility medicines. It causes multiple large ovarian cysts to form.  · Polycystic ovarian syndrome (PCOS). This is a common hormonal disorder that can cause ovarian cysts, as well as problems with your period or fertility.  What increases the risk?  The following factors may  make you more likely to develop ovarian cysts:  · Being overweight or obese.  · Taking fertility medicines.  · Taking certain forms of hormonal birth control.  · Smoking.  What are the signs or symptoms?  Many ovarian cysts do not cause symptoms. If symptoms are present, they may include:  · Pelvic pain or pressure.  · Pain in the lower abdomen.  · Pain during sex.  · Abdominal swelling.  · Abnormal menstrual periods.  · Increasing pain with menstrual periods.  How is this diagnosed?  These cysts are commonly found during a routine pelvic exam. You may have tests to find out more about the cyst, such as:  · Ultrasound.  · X-ray of the pelvis.  · CT scan.  · MRI.  · Blood tests.  How is this treated?  Many ovarian cysts go away on their own without treatment. Your health care provider may want to check your cyst regularly for 2-3 months to see if it changes. If you are in menopause, it is especially important to have your cyst monitored closely because menopausal women have a higher rate of ovarian cancer.  When treatment is needed, it may include:  · Medicines to help relieve pain.  · A procedure to drain the cyst (aspiration).  · Surgery to remove the whole cyst.  · Hormone treatment or birth control pills. These methods are sometimes used   to help dissolve a cyst.  Follow these instructions at home:  · Take over-the-counter and prescription medicines only as told by your health care provider.  · Do not drive or use heavy machinery while taking prescription pain medicine.  · Get regular pelvic exams and Pap tests as often as told by your health care provider.  · Return to your normal activities as told by your health care provider. Ask your health care provider what activities are safe for you.  · Do not use any products that contain nicotine or tobacco, such as cigarettes and e-cigarettes. If you need help quitting, ask your health care provider.  · Keep all follow-up visits as told by your health care provider.  This is important.  Contact a health care provider if:  · Your periods are late, irregular, or painful, or they stop.  · You have pelvic pain that does not go away.  · You have pressure on your bladder or trouble emptying your bladder completely.  · You have pain during sex.  · You have any of the following in your abdomen:  ? A feeling of fullness.  ? Pressure.  ? Discomfort.  ? Pain that does not go away.  ? Swelling.  · You feel generally ill.  · You become constipated.  · You lose your appetite.  · You develop severe acne.  · You start to have more body hair and facial hair.  · You are gaining weight or losing weight without changing your exercise and eating habits.  · You think you may be pregnant.  Get help right away if:  · You have abdominal pain that is severe or gets worse.  · You cannot eat or drink without vomiting.  · You suddenly develop a fever.  · Your menstrual period is much heavier than usual.  This information is not intended to replace advice given to you by your health care provider. Make sure you discuss any questions you have with your health care provider.  Document Released: 09/24/2005 Document Revised: 04/13/2016 Document Reviewed: 02/26/2016  Elsevier Interactive Patient Education © 2019 [employer] Inc.

## 2018-11-26 NOTE — Progress Notes (Signed)
  Subjective:     Patient ID: Mary Cooper, female   DOB: Nov 23, 1980, 38 y.o.   MRN: [id number]  HPI Reports lower back pain for 4 days , it radiated around to front left side. Felt like she might have over done it working around the house. Denies fever, or pain with urination, bowel movements or sex.   Review of Systems  All other systems reviewed and are negative.      Objective:   Physical Exam A&Ox4 Well groomed female Blood pressure 120/75, pulse 75, height 5\' 4"  (1.626 m), weight 213 lb 14.4 oz (97 kg), last menstrual period 11/04/2018. Body mass index is 36.72 kg/m.  Urinalysis    Component Value Date/Time   COLORURINE YELLOW (A) 06/11/2018 1821   APPEARANCEUR CLEAR (A) 06/11/2018 1821   APPEARANCEUR Cloudy (A) 11/22/2015 0843   LABSPEC 1.016 06/11/2018 1821   LABSPEC 1.015 11/15/2012 1254   PHURINE 5.0 06/11/2018 1821   GLUCOSEU NEGATIVE 06/11/2018 1821   GLUCOSEU Negative 11/15/2012 1254   HGBUR NEGATIVE 06/11/2018 1821   BILIRUBINUR Negative 10/28/2018 1057   BILIRUBINUR Negative 11/22/2015 0843   BILIRUBINUR Negative 11/15/2012 1254   KETONESUR NEGATIVE 06/11/2018 1821   PROTEINUR Positive (A) 10/28/2018 1057   PROTEINUR NEGATIVE 06/11/2018 1821   UROBILINOGEN 0.2 10/28/2018 1057   NITRITE Negative 10/28/2018 1057   NITRITE NEGATIVE 06/11/2018 1821   LEUKOCYTESUR Negative 10/28/2018 1057   LEUKOCYTESUR Negative 11/22/2015 0843   LEUKOCYTESUR 1+ 11/15/2012 1254   Pelvic exam: normal external genitalia, vulva, vagina, cervix, uterus and adnexa, ADNEXA: tenderness left, with mild fullness noted on bi-manual exam..    Assessment:     Left ovarian cyst Pelvic pressure Low back pain    Plan:     Counseled on findings and treatment options for ovarian cysts. Will try ibuprofen 800mg  tid for 4 days and then prn, if symptoms persists or worsen she will let me know and will consider hormonal suppression. RTC in 4 weeks for scheduled annual exam.  Melody  Shambley,CNM

## 2018-12-10 ENCOUNTER — Encounter: Payer: Self-pay | Admitting: Adult Health

## 2018-12-10 ENCOUNTER — Other Ambulatory Visit: Payer: Self-pay

## 2018-12-10 ENCOUNTER — Ambulatory Visit: Payer: Managed Care, Other (non HMO) | Admitting: Adult Health

## 2018-12-10 VITALS — BP 116/78 | HR 117 | Temp 100.7°F | Resp 14

## 2018-12-10 DIAGNOSIS — J020 Streptococcal pharyngitis: Secondary | ICD-10-CM | POA: Diagnosis not present

## 2018-12-10 DIAGNOSIS — J029 Acute pharyngitis, unspecified: Secondary | ICD-10-CM

## 2018-12-10 LAB — POCT RAPID STREP A (OFFICE): RAPID STREP A SCREEN: POSITIVE — AB

## 2018-12-10 LAB — POCT INFLUENZA A/B
INFLUENZA A, POC: NEGATIVE
INFLUENZA B, POC: NEGATIVE

## 2018-12-10 MED ORDER — AMOXICILLIN 875 MG PO TABS: 875.0000 mg | ORAL_TABLET | Freq: Two times a day (BID) | ORAL | 0 refills | Status: DC

## 2018-12-10 NOTE — Patient Instructions (Addendum)
Pharyngitis  Pharyngitis is a sore throat (pharynx). This is when there is redness, pain, and swelling in your throat. Most of the time, this condition gets better on its own. In some cases, you may need medicine. Follow these instructions at home:  Take over-the-counter and prescription medicines only as told by your doctor. ? If you were prescribed an antibiotic medicine, take it as told by your doctor. Do not stop taking the antibiotic even if you start to feel better. ? Do not give children aspirin. Aspirin has been linked to Reye syndrome.  Drink enough water and fluids to keep your pee (urine) clear or pale yellow.  Get a lot of rest.  Rinse your mouth (gargle) with a salt-water mixture 3-4 times a day or as needed. To make a salt-water mixture, completely dissolve -1 tsp of salt in 1 cup of warm water.  If your doctor approves, you may use throat lozenges or sprays to soothe your throat. Contact a doctor if:  You have large, tender lumps in your neck.  You have a rash.  You cough up green, yellow-brown, or bloody spit. Get help right away if:  You have a stiff neck.  You drool or cannot swallow liquids.  You cannot drink or take medicines without throwing up.  You have very bad pain that does not go away with medicine.  You have problems breathing, and it is not from a stuffy nose.  You have new pain and swelling in your knees, ankles, wrists, or elbows. Summary  Pharyngitis is a sore throat (pharynx). This is when there is redness, pain, and swelling in your throat.  If you were prescribed an antibiotic medicine, take it as told by your doctor. Do not stop taking the antibiotic even if you start to feel better.  Most of the time, pharyngitis gets better on its own. Sometimes, you may need medicine. This information is not intended to replace advice given to you by your health care provider. Make sure you discuss any questions you have with your health care  provider. Document Released: 03/12/2008 Document Revised: 10/30/2016 Document Reviewed: 10/30/2016 Elsevier Interactive Patient Education  2019 Elsevier Inc. Amoxicillin capsules or tablets What is this medicine? AMOXICILLIN (a mox i SIL in) is a penicillin antibiotic. It is used to treat certain kinds of bacterial infections. It will not work for colds, flu, or other viral infections. This medicine may be used for other purposes; ask your health care provider or pharmacist if you have questions. COMMON BRAND NAME(S): Amoxil, Moxilin, Sumox, Trimox What should I tell my health care provider before I take this medicine? They need to know if you have any of these conditions: -asthma -kidney disease -an unusual or allergic reaction to amoxicillin, other penicillins, cephalosporin antibiotics, other medicines, foods, dyes, or preservatives -pregnant or trying to get pregnant -breast-feeding How should I use this medicine? Take this medicine by mouth with a glass of water. Follow the directions on your prescription label. You may take this medicine with food or on an empty stomach. Take your medicine at regular intervals. Do not take your medicine more often than directed. Take all of your medicine as directed even if you think your are better. Do not skip doses or stop your medicine early. Talk to your pediatrician regarding the use of this medicine in children. While this drug may be prescribed for selected conditions, precautions do apply. Overdosage: If you think you have taken too much of this medicine contact a poison  control center or emergency room at once. NOTE: This medicine is only for you. Do not share this medicine with others. What if I miss a dose? If you miss a dose, take it as soon as you can. If it is almost time for your next dose, take only that dose. Do not take double or extra doses. What may interact with this medicine? -amiloride -birth control  pills -chloramphenicol -macrolides -probenecid -sulfonamides -tetracyclines This list may not describe all possible interactions. Give your health care provider a list of all the medicines, herbs, non-prescription drugs, or dietary supplements you use. Also tell them if you smoke, drink alcohol, or use illegal drugs. Some items may interact with your medicine. What should I watch for while using this medicine? Tell your doctor or health care professional if your symptoms do not improve in 2 or 3 days. Take all of the doses of your medicine as directed. Do not skip doses or stop your medicine early. If you are diabetic, you may get a false positive result for sugar in your urine with certain brands of urine tests. Check with your doctor. Do not treat diarrhea with over-the-counter products. Contact your doctor if you have diarrhea that lasts more than 2 days or if the diarrhea is severe and watery. What side effects may I notice from receiving this medicine? Side effects that you should report to your doctor or health care professional as soon as possible: -allergic reactions like skin rash, itching or hives, swelling of the face, lips, or tongue -breathing problems -dark urine -redness, blistering, peeling or loosening of the skin, including inside the mouth -seizures -severe or watery diarrhea -trouble passing urine or change in the amount of urine -unusual bleeding or bruising -unusually weak or tired -yellowing of the eyes or skin Side effects that usually do not require medical attention (report to your doctor or health care professional if they continue or are bothersome): -dizziness -headache -stomach upset -trouble sleeping This list may not describe all possible side effects. Call your doctor for medical advice about side effects. You may report side effects to FDA at 1-800-FDA-1088. Where should I keep my medicine? Keep out of the reach of children. Store between 68 and 77  degrees F (20 and 25 degrees C). Keep bottle closed tightly. Throw away any unused medicine after the expiration date. NOTE: This sheet is a summary. It may not cover all possible information. If you have questions about this medicine, talk to your doctor, pharmacist, or health care provider.  2019 Elsevier/Gold Standard (2007-12-16 14:10:59) Amoxicillin; Clavulanic Acid tablets What is this medicine? AMOXICILLIN; CLAVULANIC ACID (a mox i SIL in; KLAV yoo lan ic AS id) is a penicillin antibiotic. It is used to treat certain kinds of bacterial infections. It will not work for colds, flu, or other viral infections. This medicine may be used for other purposes; ask your health care provider or pharmacist if you have questions. COMMON BRAND NAME(S): Augmentin What should I tell my health care provider before I take this medicine? They need to know if you have any of these conditions: -bowel disease, like colitis -kidney disease -liver disease -mononucleosis -an unusual or allergic reaction to amoxicillin, penicillin, cephalosporin, other antibiotics, clavulanic acid, other medicines, foods, dyes, or preservatives -pregnant or trying to get pregnant -breast-feeding How should I use this medicine? Take this medicine by mouth with a full glass of water. Follow the directions on the prescription label. Take at the start of a meal. Do not  crush or chew. If the tablet has a score line, you may cut it in half at the score line for easier swallowing. Take your medicine at regular intervals. Do not take your medicine more often than directed. Take all of your medicine as directed even if you think you are better. Do not skip doses or stop your medicine early. Talk to your pediatrician regarding the use of this medicine in children. Special care may be needed. Overdosage: If you think you have taken too much of this medicine contact a poison control center or emergency room at once. NOTE: This medicine is  only for you. Do not share this medicine with others. What if I miss a dose? If you miss a dose, take it as soon as you can. If it is almost time for your next dose, take only that dose. Do not take double or extra doses. What may interact with this medicine? -allopurinol -anticoagulants -birth control pills -methotrexate -probenecid This list may not describe all possible interactions. Give your health care provider a list of all the medicines, herbs, non-prescription drugs, or dietary supplements you use. Also tell them if you smoke, drink alcohol, or use illegal drugs. Some items may interact with your medicine. What should I watch for while using this medicine? Tell your doctor or health care professional if your symptoms do not improve. Do not treat diarrhea with over the counter products. Contact your doctor if you have diarrhea that lasts more than 2 days or if it is severe and watery. If you have diabetes, you may get a false-positive result for sugar in your urine. Check with your doctor or health care professional. Birth control pills may not work properly while you are taking this medicine. Talk to your doctor about using an extra method of birth control. What side effects may I notice from receiving this medicine? Side effects that you should report to your doctor or health care professional as soon as possible: -allergic reactions like skin rash, itching or hives, swelling of the face, lips, or tongue -breathing problems -dark urine -fever or chills, sore throat -redness, blistering, peeling or loosening of the skin, including inside the mouth -seizures -trouble passing urine or change in the amount of urine -unusual bleeding, bruising -unusually weak or tired -white patches or sores in the mouth or throat Side effects that usually do not require medical attention (report to your doctor or health care professional if they continue or are  bothersome): -diarrhea -dizziness -headache -nausea, vomiting -stomach upset -vaginal or anal irritation This list may not describe all possible side effects. Call your doctor for medical advice about side effects. You may report side effects to FDA at 1-800-FDA-1088. Where should I keep my medicine? Keep out of the reach of children. Store at room temperature below 25 degrees C (77 degrees F). Keep container tightly closed. Throw away any unused medicine after the expiration date. NOTE: This sheet is a summary. It may not cover all possible information. If you have questions about this medicine, talk to your doctor, pharmacist, or health care provider.  2019 Elsevier/Gold Standard (2007-12-18 12:04:30)

## 2018-12-10 NOTE — Progress Notes (Signed)
[facility]   

## 2018-12-10 NOTE — Progress Notes (Signed)
[facility]  Subjective:     Patient ID: Mary Cooper, female   DOB: 1981-01-11, 38 y.o.   MRN: [id number]  HPI  Blood pressure 116/78, pulse (!) 117, temperature (!) 100.7 F (38.2 C), temperature source Oral, resp. rate 14, last menstrual period 12/02/2018, SpO2 98 %. Recheck heart rate 106   Patient is a 38 year old female in no acute distress who comes to the clinic with complaints of sore throat x 3 days.  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 Denies any difficulty swallowing food or liquids. She does have painful swallowing. She has not taken any fever reducers for pain or fever.  Low grade fever x 2 days.   Patient  denies any fever, body aches,chills, rash, chest pain, shortness of breath, nausea, vomiting, or diarrhea.    No Known Allergies  Patient Active Problem List   Diagnosis Date Noted  . 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  hydrOXYzine (ATARAX/VISTARIL) 25 MG tablet, Take 1 tablet (25 mg total) by mouth 3 (three) times daily as needed for anxiety., Disp: 20 tablet, Rfl: 0  .  venlafaxine XR (EFFEXOR-XR) 37.5 MG 24 hr capsule, , Disp: , Rfl:  She is not taking Ibuprofen above.  Review of Systems  Constitutional: Positive for fatigue. Negative for activity change, appetite change, chills, diaphoresis, fever and unexpected weight change.  HENT: Positive for sore throat. Negative for congestion, dental problem, drooling, ear discharge, ear pain, facial swelling, hearing loss, mouth sores, nosebleeds, postnasal drip, rhinorrhea, sinus pressure, sinus pain, sneezing, tinnitus, trouble swallowing and voice change.   Eyes: Negative.   Respiratory: Negative.   Cardiovascular: Negative.   Gastrointestinal: Negative.   Endocrine: Negative.   Genitourinary: Negative.   Musculoskeletal: Negative.   Skin: Negative.   Allergic/Immunologic: Negative.   Neurological: Negative.   Hematological:  Negative.   Psychiatric/Behavioral: Negative.        Objective:   Physical Exam Vitals signs reviewed.  Constitutional:      General: She is not in acute distress.    Appearance: Normal appearance. She is well-developed and well-groomed. She is not ill-appearing, toxic-appearing or diaphoretic.     Comments: Patient is alert and oriented and responsive to questions Engages in eye contact with provider. Speaks in full sentences without any pauses without any shortness of breath or distress.  Normal phonation.  No stridor.   HENT:     Head: Normocephalic and atraumatic.     Right Ear: Tympanic membrane and ear canal normal. No drainage, swelling or tenderness. No middle ear effusion. Tympanic membrane is not erythematous.     Left Ear: No drainage, swelling or tenderness.  No middle ear effusion. Tympanic membrane is not erythematous.     Nose: No congestion or rhinorrhea.     Mouth/Throat:     Mouth: Mucous membranes are moist. No oral lesions.     Pharynx: Uvula midline. Oropharyngeal exudate and posterior oropharyngeal erythema present. No pharyngeal swelling or uvula swelling.     Tonsils: Tonsillar exudate (white bilateral tonsil more on left. oral pharynx patent. ) present. No tonsillar abscesses. Swelling: 1+ on the right. 2+ on the left.  Eyes:     Extraocular Movements:     Right eye: Normal extraocular motion.     Left eye: Normal extraocular motion.     Conjunctiva/sclera: Conjunctivae normal.  Neck:     Musculoskeletal: Normal range of motion and neck supple.  Cardiovascular:     Rate and Rhythm: Normal rate and regular rhythm.  Pulmonary:     Effort: Pulmonary effort is normal. No respiratory distress.     Breath sounds: Normal breath sounds. No stridor. No wheezing, rhonchi or rales.  Chest:     Chest wall: No tenderness.  Abdominal:     Palpations: Abdomen is soft.  Musculoskeletal:     Comments: Patient moves on and off of exam table and in room without  difficulty. Gait is normal in hall and in room. Patient is oriented to person place time and situation. Patient answers questions appropriately and engages in conversation.   Skin:    General: Skin is warm and dry.     Capillary Refill: Capillary refill takes less than 2 seconds.  Neurological:     Mental Status: She is alert and oriented to person, place, and time.  Psychiatric:        Mood and Affect: Mood normal.        Behavior: Behavior normal. Behavior is cooperative.        Assessment:     .Sore throat - Plan: POCT Rapid Strep A-Office (CPT [phone]), POCT Influenza A/B (POC66)  Acute streptococcal pharyngitis  Results for orders placed or performed in visit on 12/10/18 (from the past 24 hour(s))  POCT Rapid Strep A-Office (CPT [phone])     Status: Abnormal   Collection Time: 12/10/18 10:33 AM  Result Value Ref Range   Rapid Strep A Screen Positive (A) Negative  POCT Influenza A/B (RWE31)     Status: None   Collection Time: 12/10/18 10:33 AM  Result Value Ref Range   Influenza A, POC Negative Negative   Influenza B, POC Negative Negative       Plan:     Mary Cooper was seen today for sore throat and generalized body aches.  Diagnoses and all orders for this visit:  Sore throat -     POCT Rapid Strep A-Office (CPT [phone]) -     POCT Influenza A/B (POC66)  Acute streptococcal pharyngitis  Other orders -     amoxicillin (AMOXIL) 875 MG tablet; Take 1 tablet (875 mg total) by mouth 2 (two) times daily.   Recommend Motrin 600 mg every 8 hours as needed for throat pain and fever.  Gave and reviewed After Visit Summary(AVS) with patient. Patient is advised to read the after visit summary as well and let the provider know if any question, concerns or clarifications are needed.   Advised patient call the office or your primary care doctor for an appointment if no improvement within 72 hours or if any symptoms change or worsen at any time  Advised ER or urgent Care if after hours  or on weekend. Call 911 for emergency symptoms at any time.Patinet verbalized understanding of all instructions given/reviewed and treatment plan and has no further questions or concerns at this time.    Patient verbalized understanding of all instructions given and denies any further questions at this time.

## 2018-12-11 ENCOUNTER — Other Ambulatory Visit: Payer: Self-pay

## 2018-12-11 ENCOUNTER — Emergency Department
Admission: EM | Admit: 2018-12-11 | Discharge: 2018-12-12 | Discharge status: Against Medical Advice | Payer: Managed Care, Other (non HMO) | Attending: Emergency Medicine | Admitting: Emergency Medicine

## 2018-12-11 ENCOUNTER — Encounter: Payer: Self-pay | Admitting: Emergency Medicine

## 2018-12-11 DIAGNOSIS — J029 Acute pharyngitis, unspecified: Secondary | ICD-10-CM | POA: Insufficient documentation

## 2018-12-11 DIAGNOSIS — E860 Dehydration: Secondary | ICD-10-CM | POA: Diagnosis not present

## 2018-12-11 DIAGNOSIS — R519 Headache, unspecified: Secondary | ICD-10-CM

## 2018-12-11 DIAGNOSIS — R51 Headache: Secondary | ICD-10-CM | POA: Diagnosis present

## 2018-12-11 DIAGNOSIS — J020 Streptococcal pharyngitis: Secondary | ICD-10-CM

## 2018-12-11 MED ORDER — SODIUM CHLORIDE 0.9 % IV BOLUS
1000.0000 mL | Freq: Once | INTRAVENOUS | Status: AC
  Administered 2018-12-11: 1000 mL via INTRAVENOUS

## 2018-12-11 MED ORDER — KETOROLAC TROMETHAMINE 30 MG/ML IJ SOLN
30.0000 mg | Freq: Once | INTRAMUSCULAR | Status: AC
  Administered 2018-12-11: 30 mg via INTRAVENOUS
  Filled 2018-12-11: qty 1

## 2018-12-11 MED ORDER — PENICILLIN G BENZATHINE & PROC 1200000 UNIT/2ML IM SUSP
1.2000 10*6.[IU] | Freq: Once | INTRAMUSCULAR | Status: AC
  Administered 2018-12-11: 1.2 10*6.[IU] via INTRAMUSCULAR
  Filled 2018-12-11: qty 2

## 2018-12-11 MED ORDER — ONDANSETRON HCL 4 MG/2ML IJ SOLN
4.0000 mg | Freq: Once | INTRAMUSCULAR | Status: AC
  Administered 2018-12-11: 4 mg via INTRAVENOUS
  Filled 2018-12-11: qty 2

## 2018-12-11 NOTE — ED Provider Notes (Signed)
[facility] Emergency Department Provider Note ____________________________________   First MD Initiated Contact with Patient 12/11/18 2302     (approximate)  I have reviewed the triage vital signs and the nursing notes.   HISTORY  Chief Complaint Headache    HPI Mary Cooper is a 38 y.o. female recently diagnosed with strep pharyngitis yesterday presents to the emergency department right frontal headache today accompanied by poor p.o. intake.  Patient denies any weakness numbness gait instability or visual changes.  Patient was seen and diagnosed with strep yesterday prescribed amoxicillin.  Patient taken medications as prescribed.        Past Medical History:  Diagnosis Date  . GERD (gastroesophageal reflux disease)     Patient Active Problem List   Diagnosis Date Noted  . 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 DILATION AND CURETTAGE OF UTERUS  2006  . MOUTH SURGERY    . TUBAL LIGATION N/A 05/03/2016   Procedure: POST PARTUM TUBAL LIGATION;  Surgeon: Hildred Laser, MD;  Location: [facility] ORS;  Service: Gynecology;  Laterality: N/A;    Prior to Admission medications   Medication Sig Start Date End Date Taking? Authorizing Provider  amoxicillin (AMOXIL) 875 MG tablet Take 1 tablet (875 mg total) by mouth 2 (two) times daily. 12/10/18   Flinchum, Eula Fried, FNP  hydrOXYzine (ATARAX/VISTARIL) 25 MG tablet Take 1 tablet (25 mg total) by mouth 3 (three) times daily as needed for anxiety. 06/11/18   [name], MD  ibuprofen (ADVIL,MOTRIN) 800 MG tablet Take 1 tablet (800 mg total) by mouth every 8 (eight) hours as needed. 11/26/18   Shambley, Melody N, CNM  venlafaxine XR (EFFEXOR-XR) 37.5 MG 24 hr capsule Take 75 mg by mouth.  10/25/18   [provider]    Allergies Patient has no known allergies.  Family History  Problem Relation Age of Onset  . Cancer Paternal Grandmother    BREAST  . Heart attack Father   . Diabetes Father   . Diabetes Mother     Social History Social History   Tobacco Use  . Smoking status: Former Smoker    Packs/day: 0.25    Years: 15.00    Pack years: 3.75    Types: Cigarettes    Last attempt to quit: 09/08/2015    Years since quitting: 3.2  . Smokeless tobacco: Never Used  Substance Use Topics  . Alcohol use: No  . Drug use: No    Review of Systems Constitutional: No fever/chills Eyes: No visual changes. ENT: Positive for sore throat Cardiovascular: Denies chest pain. Respiratory: Denies shortness of breath. Gastrointestinal: No abdominal pain.  No nausea, no vomiting.  No diarrhea.  No constipation. Genitourinary: Negative for dysuria. Musculoskeletal: Negative for neck pain.  Negative for back pain. Integumentary: Negative for rash. Neurological: Positive for headaches, negative for focal weakness or numbness.   ____________________________________________   PHYSICAL EXAM:  VITAL SIGNS: ED Triage Vitals  Enc Vitals Group     BP 12/11/18 1906 136/85     Pulse Rate 12/11/18 1906 77     Resp 12/11/18 1906 18     Temp 12/11/18 1906 98.3 F (36.8 C)     Temp Source 12/11/18 1906 Oral     SpO2 12/11/18 1906 97 %     Weight 12/11/18 1906 96.2 kg (212 lb)     Height 12/11/18 1906 1.626 m (5\' 4" )     Head Circumference --  Peak Flow --      Pain Score 12/11/18 1904 8     Pain Loc --      Pain Edu? --      Excl. in GC? --     Constitutional: Alert and oriented. Well appearing and in no acute distress. Eyes: Conjunctivae are normal. PERRL. EOMI. Nose: No congestion/rhinnorhea. Mouth/Throat: Mucous membranes are dry.pharyngeal erythema with exudates no evidence of peritonsillar abscess  neck: No stridor.  Positive anterior cervical lymphadenopathy Cardiovascular: Normal rate, regular rhythm. Good peripheral circulation. Grossly normal heart sounds. Respiratory: Normal respiratory effort.  No retractions.  Lungs CTAB. Gastrointestinal: Soft and nontender. No distention.   Neurologic:  Normal speech and language. No gross focal neurologic deficits are appreciated.  Skin:  Skin is warm, dry and intact. No rash noted. Psychiatric: Mood and affect are normal. Speech and behavior are normal.  ____________________________________________    Procedures   ____________________________________________   INITIAL IMPRESSION / MDM / ASSESSMENT AND PLAN / ED COURSE  As part of my medical decision making, I reviewed the following data within the electronic MEDICAL RECORD NUMBER   38 year old female presented with above-stated history and physical exam secondary to headache with diagnosis of strep throat yesterday with poor p.o. intake today.  Patient given Toradol 30 mg Zofran 4 mg and a liter normal saline.  On reevaluation patient states headache is resolved.  In addition patient given penicillin G1 200,000 units ____________________________________________  FINAL CLINICAL IMPRESSION(S) / ED DIAGNOSES  Final diagnoses:  Strep pharyngitis  Dehydration  Acute nonintractable headache, unspecified headache type     MEDICATIONS GIVEN DURING THIS VISIT:  Medications  ketorolac (TORADOL) 30 MG/ML injection 30 mg (has no administration in time range)  ondansetron (ZOFRAN) injection 4 mg (has no administration in time range)  sodium chloride 0.9 % bolus 1,000 mL (has no administration in time range)  penicillin g procaine-penicillin g benzathine (BICILLIN-CR) injection 600000-600000 units (has no administration in time range)     ED Discharge Orders    None       Note:  This document was prepared using Dragon voice recognition software and may include unintentional dictation errors.   Darci Current, MD 12/12/18 [id number]

## 2018-12-11 NOTE — ED Notes (Signed)
Pt states she tested positive for strep throat on yesterday and the HA that's associated with is gradually getting worse. States that nothing otc is helping. It is also making her feel nauseous. Pt is tearful.

## 2018-12-11 NOTE — ED Triage Notes (Addendum)
Patient ambulatory to triage with steady gait, without difficulty or distress noted, mask placed; pt st strep + yesterday; currently taking amoxicillin; pt c/o increasing frontal HA; denies hx HA; tylenol taken approx 5pm without relief

## 2018-12-11 NOTE — ED Notes (Signed)
Pt was asleep and did not hear her name called.  Pt returned to waiting in lobby list.

## 2018-12-26 ENCOUNTER — Encounter: Payer: Managed Care, Other (non HMO) | Admitting: Obstetrics and Gynecology

## 2018-12-29 ENCOUNTER — Encounter: Payer: Self-pay | Admitting: *Deleted

## 2018-12-29 NOTE — Telephone Encounter (Signed)
The patient has been rescheduled for May, due to COVID, no further action required at this time.

## 2018-12-30 ENCOUNTER — Encounter: Payer: Managed Care, Other (non HMO) | Admitting: Obstetrics and Gynecology

## 2019-01-01 ENCOUNTER — Encounter: Payer: Managed Care, Other (non HMO) | Admitting: Obstetrics and Gynecology

## 2019-01-28 NOTE — Addendum Note (Signed)
Addended by: Berniece Pap on: 01/28/2019 02:06 PM   Modules accepted: Level of Service

## 2019-01-29 ENCOUNTER — Emergency Department: Payer: Managed Care, Other (non HMO)

## 2019-01-29 ENCOUNTER — Other Ambulatory Visit: Payer: Self-pay

## 2019-01-29 ENCOUNTER — Emergency Department
Admission: EM | Admit: 2019-01-29 | Discharge: 2019-01-29 | Disposition: A | Discharge status: Home / Self Care | Payer: Managed Care, Other (non HMO) | Attending: Emergency Medicine | Admitting: Emergency Medicine

## 2019-01-29 DIAGNOSIS — F419 Anxiety disorder, unspecified: Secondary | ICD-10-CM | POA: Insufficient documentation

## 2019-01-29 DIAGNOSIS — R42 Dizziness and giddiness: Secondary | ICD-10-CM

## 2019-01-29 DIAGNOSIS — Z79899 Other long term (current) drug therapy: Secondary | ICD-10-CM | POA: Diagnosis not present

## 2019-01-29 HISTORY — DX: Anxiety disorder, unspecified: F41.9

## 2019-01-29 LAB — COMPREHENSIVE METABOLIC PANEL
ALT: 27 U/L (ref 0–44)
AST: 32 U/L (ref 15–41)
Albumin: 4.5 g/dL (ref 3.5–5.0)
Alkaline Phosphatase: 91 U/L (ref 38–126)
Anion gap: 10 (ref 5–15)
BUN: 8 mg/dL (ref 6–20)
CO2: 26 mmol/L (ref 22–32)
Calcium: 9.2 mg/dL (ref 8.9–10.3)
Chloride: 103 mmol/L (ref 98–111)
Creatinine, Ser: 0.65 mg/dL (ref 0.44–1.00)
GFR calc Af Amer: >60 mL/min (ref >60)
GFR calc non Af Amer: >60 mL/min (ref >60)
Glucose, Bld: 138 mg/dL — ABNORMAL HIGH (ref 70–99)
Potassium: 3.6 mmol/L (ref 3.5–5.1)
Sodium: 139 mmol/L (ref 135–145)
Total Bilirubin: 0.2 mg/dL — ABNORMAL LOW (ref 0.3–1.2)
Total Protein: 8.4 g/dL — ABNORMAL HIGH (ref 6.5–8.1)

## 2019-01-29 LAB — TROPONIN I: Troponin I: <0.03 ng/mL (ref <0.03)

## 2019-01-29 LAB — CBC
HCT: 40.9 % (ref 36.0–46.0)
Hemoglobin: 12.6 g/dL (ref 12.0–15.0)
MCH: 24.9 pg — ABNORMAL LOW (ref 26.0–34.0)
MCHC: 30.8 g/dL (ref 30.0–36.0)
MCV: 80.8 fL (ref 80.0–100.0)
Platelets: 305 10*3/uL (ref 150–400)
RBC: 5.06 MIL/uL (ref 3.87–5.11)
RDW: 14.2 % (ref 11.5–15.5)
WBC: 7.4 10*3/uL (ref 4.0–10.5)
nRBC: 0 % (ref 0.0–0.2)

## 2019-01-29 MED ORDER — ACETAMINOPHEN 325 MG PO TABS: 650.0000 mg | ORAL_TABLET | Freq: Once | ORAL | Status: DC

## 2019-01-29 MED ORDER — SODIUM CHLORIDE 0.9 % IV SOLN
1000.0000 mL | Freq: Once | INTRAVENOUS | Status: AC
  Administered 2019-01-29: 20:00:00 1000 mL via INTRAVENOUS

## 2019-01-29 NOTE — Discharge Instructions (Addendum)
Follow-up with your regular doctor to discuss your medications.  Return to the emergency department if you are worsening.  All of your test were negative today.

## 2019-01-29 NOTE — ED Notes (Signed)
Pt given warm blankets. Rail up. Bed in lowest position. Call bell within reach.

## 2019-01-29 NOTE — ED Notes (Signed)
This RN attempted IV at L fa x1.

## 2019-01-29 NOTE — ED Notes (Signed)
EDP Kinner at bedside.  

## 2019-01-29 NOTE — ED Notes (Signed)
2nd RN attempting for IV.

## 2019-01-29 NOTE — ED Triage Notes (Signed)
Pt in from home to triage with steady walk. Pt states has been experiencing weird noise in L ear; tingling in L hand/lips/tongue; denies N/V/D/HA; states she takes anxiety meds and has taken her "emergency med" today as she remains anxious. States has been feeling dizzy on-off since this morning.

## 2019-01-29 NOTE — ED Provider Notes (Signed)
[facility] Emergency Department Provider Note  ____________________________________________   First MD Initiated Contact with Patient 01/29/19 1846     (approximate)  I have reviewed the triage vital signs and the nursing notes.   HISTORY  Chief Complaint Dizziness    HPI Mary Cooper is a 38 y.o. female presents emergency department complaining of weird noises in the left ear, tingling in the left hand, lips and tongue  help numb and tingly.  She states she has been dizzy on and off since this morning.  She is concerned and is afraid something is wrong with her.  She states she also has a history of anxiety and panic attacks.  She did use her rescue medication for anxiety which did not help with the symptoms.   Past Medical History:  Diagnosis Date  . Anxiety   . GERD (gastroesophageal reflux disease)     Patient Active Problem List   Diagnosis Date Noted  . 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 DILATION AND CURETTAGE OF UTERUS  2006  . MOUTH SURGERY    . TUBAL LIGATION N/A 05/03/2016   Procedure: POST PARTUM TUBAL LIGATION;  Surgeon: Rubie Maid, MD;  Location: [facility] ORS;  Service: Gynecology;  Laterality: N/A;    Prior to Admission medications   Medication Sig Start Date End Date Taking? Authorizing Provider  amoxicillin (AMOXIL) 875 MG tablet Take 1 tablet (875 mg total) by mouth 2 (two) times daily. 12/10/18   Flinchum, Kelby Aline, FNP  hydrOXYzine (ATARAX/VISTARIL) 25 MG tablet Take 1 tablet (25 mg total) by mouth 3 (three) times daily as needed for anxiety. 06/11/18   Harvest Dark, MD  ibuprofen (ADVIL,MOTRIN) 800 MG tablet Take 1 tablet (800 mg total) by mouth every 8 (eight) hours as needed. 11/26/18   Shambley, Melody N, CNM  venlafaxine XR (EFFEXOR-XR) 37.5 MG 24 hr capsule Take 75 mg by mouth.  10/25/18   [provider]    Allergies Patient has no known allergies.   Family History  Problem Relation Age of Onset  . Cancer Paternal Grandmother        BREAST  . Heart attack Father   . Diabetes Father   . Diabetes Mother     Social History Social History   Tobacco Use  . Smoking status: Former Smoker    Packs/day: 0.25    Years: 15.00    Pack years: 3.75    Types: Cigarettes    Last attempt to quit: 09/08/2015    Years since quitting: 3.3  . Smokeless tobacco: Never Used  Substance Use Topics  . Alcohol use: Yes    Comment: very rare per pt   . Drug use: No    Review of Systems  Constitutional: No fever/chills Eyes: No visual changes. ENT: No sore throat. Respiratory: Denies cough Genitourinary: Negative for dysuria. Musculoskeletal: Negative for back pain. Neurological: Positive for numbness to the tongue and lips, dizziness Skin: Negative for rash.    ____________________________________________   PHYSICAL EXAM:  VITAL SIGNS: ED Triage Vitals  Enc Vitals Group     BP 01/29/19 1846 (!) 134/101     Pulse Rate 01/29/19 1846 78     Resp --      Temp 01/29/19 1846 98.3 F (36.8 C)     Temp Source 01/29/19 1846 Oral     SpO2 01/29/19 1846 99 %     Weight 01/29/19 1850 215 lb (97.5 kg)  Height 01/29/19 1850 _0  (1.626 m)     Head Circumference --      Peak Flow --      Pain Score 01/29/19 1850 0     Pain Loc --      Pain Edu? --      Excl. in [place]? --     Constitutional: Alert and oriented. Well appearing and in no acute distress. Eyes: Conjunctivae are normal. Perrl, no nystagmus noted Head: Atraumatic. Nose: No congestion/rhinnorhea. Mouth/Throat: Mucous membranes are moist.   Neck:  supple no lymphadenopathy noted Cardiovascular: Normal rate, regular rhythm. Heart sounds are normal Respiratory: Normal respiratory effort.  No retractions, lungs c t a  GU: deferred Musculoskeletal: FROM all extremities, warm and well perfused Neurologic:  Normal speech and language.  Radial nerves II through XII grossly  intact Skin:  Skin is warm, dry and intact. No rash noted. Psychiatric: Mood and affect are normal. Speech and behavior are normal.  ____________________________________________   LABS (all labs ordered are listed, but only abnormal results are displayed)  Labs Reviewed  CBC - Abnormal; Notable for the following components:      Result Value   MCH 24.9 (*)    All other components within normal limits  COMPREHENSIVE METABOLIC PANEL - Abnormal; Notable for the following components:   Glucose, Bld 138 (*)    Total Protein 8.4 (*)    Total Bilirubin 0.2 (*)    All other components within normal limits  TROPONIN I   ____________________________________________   ____________________________________________  RADIOLOGY  CT of the head is normal  ____________________________________________   PROCEDURES  Procedure(s) performed: No  Procedures    ____________________________________________   INITIAL IMPRESSION / ASSESSMENT AND PLAN / ED COURSE  Pertinent labs & imaging results that were available during my care of the patient were reviewed by me and considered in my medical decision making (see chart for details).   Patient is 38 year old female presents emergency department complaining of dizziness, tingling in the left hand, lips and tongue feel numb.  Patient has history of anxiety and took her rescue medications without any relief of symptoms.  Physical exam patient appears well.  No slurred speech is noted.  Cranial nerves II through XII grossly intact.  Remainder the exam is unremarkable  CT of the head CBC and comprehensive metabolic panel ordered from triage  ----------------------------------------- 8:39 PM on 01/29/2019 -----------------------------------------  CT of the head is normal, CBC, comprehensive metabolic panel and troponin are normal  Explained all the test results to the patient.  She is to follow-up with her regular doctor concerning her  anxiety medications.  She states she understands will comply.  She was discharged in stable condition.    As part of my medical decision making, I reviewed the following data within the [place] notes reviewed and incorporated, Labs reviewed CBC met C and troponin are normal, Old chart reviewed, Radiograph reviewed CT of the head is normal, Evaluated by EM attending Dr. Corky Downs, Notes from prior ED visits and [place] Controlled Substance Database  ____________________________________________   FINAL CLINICAL IMPRESSION(S) / ED DIAGNOSES  Final diagnoses:  Anxiety  Lightheadedness      NEW MEDICATIONS STARTED DURING THIS VISIT:  New Prescriptions   No medications on file     Note:  This document was prepared using Dragon voice recognition software and may include unintentional dictation errors.    Versie Starks, PA-C 01/29/19 2039    Lavonia Drafts, MD 01/29/19  2104

## 2019-01-29 NOTE — ED Notes (Signed)
Pt leaving for CT.  

## 2019-02-25 ENCOUNTER — Encounter: Payer: Managed Care, Other (non HMO) | Admitting: Obstetrics and Gynecology

## 2019-05-27 ENCOUNTER — Ambulatory Visit
Admission: RE | Admit: 2019-05-27 | Discharge: 2019-05-27 | Disposition: A | Discharge status: Home / Self Care | Payer: Managed Care, Other (non HMO) | Source: Ambulatory Visit | Attending: Adult Health | Admitting: Adult Health

## 2019-05-27 ENCOUNTER — Ambulatory Visit: Payer: Managed Care, Other (non HMO) | Admitting: Adult Health

## 2019-05-27 ENCOUNTER — Other Ambulatory Visit: Payer: Self-pay | Admitting: Adult Health

## 2019-05-27 ENCOUNTER — Encounter: Payer: Self-pay | Admitting: Adult Health

## 2019-05-27 ENCOUNTER — Other Ambulatory Visit: Payer: Self-pay

## 2019-05-27 VITALS — BP 118/72 | HR 83 | Temp 98.3°F | Resp 18 | Ht 64.0 in | Wt 229.0 lb

## 2019-05-27 DIAGNOSIS — Z008 Encounter for other general examination: Secondary | ICD-10-CM

## 2019-05-27 DIAGNOSIS — N912 Amenorrhea, unspecified: Secondary | ICD-10-CM

## 2019-05-27 DIAGNOSIS — N390 Urinary tract infection, site not specified: Secondary | ICD-10-CM | POA: Diagnosis not present

## 2019-05-27 DIAGNOSIS — Z0189 Encounter for other specified special examinations: Secondary | ICD-10-CM | POA: Diagnosis not present

## 2019-05-27 LAB — POCT URINALYSIS DIPSTICK
Bilirubin, UA: NEGATIVE
Blood, UA: NEGATIVE
Glucose, UA: NEGATIVE
Ketones, UA: NEGATIVE
Nitrite, UA: NEGATIVE
Protein, UA: POSITIVE — AB
Spec Grav, UA: 1.02 (ref 1.010–1.025)
Urobilinogen, UA: 0.2 E.U./dL
pH, UA: 7.5 (ref 5.0–8.0)

## 2019-05-27 LAB — POCT URINE PREGNANCY: Preg Test, Ur: NEGATIVE

## 2019-05-27 MED ORDER — CEPHALEXIN 500 MG PO CAPS: 500.0000 mg | ORAL_CAPSULE | Freq: Three times a day (TID) | ORAL | 0 refills | Status: DC

## 2019-05-27 NOTE — Progress Notes (Addendum)
[facility]   Patient ID: Mary Cooper DOB: 37 y.o. MRN: [id number]   Subjective: Patient is a 38 year old female in no acute distress who comes to the clinic for amenorhea, nausea x 2 weeks and mild odor to urine. Denies any hematuria.   No vaginal discharge, sores or lesions.  Intermittent lower back pain generalized x 1 day.   She reports no menstrual cycle since May, she did have one episode in July of mild spotting dark brown discharge.  Urinating normally Bowel movements are normal. No melena or mucous.   History of ovarian cyst left in 2016.   She reports she has mild swelling in her feet at times, and hands. She has seen Dr. Gorden Cooper her PCP who told her to cut out mountain dews and decrease sodium intake. He also gave her Lasix 20 mg as needed which she does not take.   Patient  denies any fever, body aches,chills, rash, chest pain, shortness of breath, vomiting, or diarrhea.   She has not seen her OBGYN in a while due to the office had to cancel.  She had a tubal ligation 3 years ago.   She also would like her brief biometric exam today and will come back for fasting glucose and cholesterol. She works with [employer]. She has three young children youngest is 71 years old. She stays busy with her kids. She does not exercise. She says she has room to improve in her diet and drinks many mountain due.  She also has a history of anxiety and it is controlled with medications listed below.   Patient Active Problem List   Diagnosis Date Noted  . 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 furosemide (LASIX) 20 MG tablet Take by mouth.    . hydrOXYzine (ATARAX/VISTARIL) 25 MG tablet Take 1 tablet (25 mg total) by mouth 3 (three) times daily as needed for anxiety. 20 tablet 0  . PREVIDENT 5000 BOOSTER PLUS 1.1 % PSTE USE ONCE DAILY IN PLACE OF  REGULAR TOOTHPASTE    . venlafaxine XR (EFFEXOR-XR) 37.5 MG 24 hr capsule Take 75 mg by mouth.      No current facility-administered medications on file prior to visit.   She does not take the PRN Lasix- did not pick up from her pharmacy.   Objective: Blood pressure 118/72, pulse 83, temperature 98.3 F (36.8 C), temperature source Temporal, resp. rate 18, height 5' 4" (1.626 m), weight 229 lb (103.9 kg), last menstrual period 01/25/2019, SpO2 97 %.  Body mass index is 39.31 kg/m. Physical Exam  Constitutional: She is oriented to person, place, and time and well-developed, well-nourished, and in no distress. Vital signs are normal. No distress.  HENT:  Head: Normocephalic and atraumatic.  Right Ear: Hearing, tympanic membrane, external ear and ear canal normal.  Left Ear: Hearing, tympanic membrane, external ear and ear canal normal.  Nose: Nose normal.  Mouth/Throat: Uvula is midline, oropharynx is clear and moist and mucous membranes are normal.  Eyes: Pupils are equal, round, and reactive to light. Conjunctivae and EOM are normal. Right eye exhibits no discharge. Left eye exhibits no discharge. No scleral icterus.  Neck: Normal range of motion. Neck supple.  Cardiovascular: Normal rate, regular rhythm, S1 normal, S2 normal, normal heart sounds and intact distal pulses. Exam reveals no gallop, no S3, no S4, no distant heart sounds and no  friction rub.  No murmur heard. Pulses:      Radial pulses are 2+ on the right side and 2+ on the left side.       Dorsalis pedis pulses are 2+ on the right side and 2+ on the left side.       Posterior tibial pulses are 2+ on the right side and 2+ on the left side.  Pulmonary/Chest: Effort normal and breath sounds normal. No accessory muscle usage. No respiratory distress. She has no wheezes. She has no rales. She exhibits no tenderness.  Abdominal: Soft. Normal appearance and bowel sounds are normal. She exhibits no distension, no abdominal bruit, no  pulsatile midline mass and no mass. There is no hepatosplenomegaly, splenomegaly or hepatomegaly. There is generalized abdominal tenderness and tenderness in the suprapubic area. There is no rigidity, no rebound, no guarding, no CVA tenderness, no tenderness at McBurney's point and negative Murphy's sign.    Mild tenderness lower pelvis/ suprapubic  Jump test negative.   Musculoskeletal: Normal range of motion.        General: No tenderness, deformity or edema.  Lymphadenopathy:    She has no cervical adenopathy.       Right cervical: No superficial cervical, no deep cervical and no posterior cervical adenopathy present.      Left cervical: No superficial cervical, no deep cervical and no posterior cervical adenopathy present.  Neurological: She is alert and oriented to person, place, and time. She has normal motor skills. Gait normal.  Skin: Skin is warm, dry and intact. She is not diaphoretic. No cyanosis. No pallor.  No appreciable swelling in hands or feet noticed at this visit.   Psychiatric: Mood, memory, affect and judgment normal.  Vitals reviewed.  No gynecology exams done in this office currently and no equipment available. Patient is aware she will have to see gynecology if needed for any pelvic/vaginal exam and will follow up with gynecology/obgyn as needed. Yearly gynecology pelvic exam recommended. Patient verbalized understanding of instructions and denies any further questions at this time.    Assessment:    ICD-10-CM   1. Amenorrhea  N91.2 POCT Urinalysis Dipstick (CPT 81002)    POCT Urine Pregnancy (CPT 81025)    Urine Culture    TSH    CBC with Diff    Comp Met (CMET)    hCG, serum, qualitative    US Pelvic Complete With Transvaginal  2. Encounter for biometric screening  Z01.89   3. Urinary tract infection without hematuria, site unspecified  N39.0   4. Encounter for other general examination- brief biometric exam with screening- not a full annual physical   Z00.8     Leukocytes trace, protein trace-(only abnormal's.)  Plan: Pelvic Ultrasound Complete at 4 pm today - Arrive 20 minutes prior to appointment 3:40pm  [place]  [place] [phone]  Decrease mountain dews, increase water and hydration with Urinary tract infection.   Follow up with your gynecologist in 1 week, sooner if needed.  Follow up with primary care as well.   Lab appointment 06/02/2019 for fasting glucose and lipids for biometric screening form in this clinic 9am.  Treatment options discussed, as well as risks, benefits, alternatives. Patient voiced understanding and agreement with the following plans:  Meds ordered this encounter  Medications  . cephALEXin (KEFLEX) 500 MG capsule    Sig: Take 1 capsule (500 mg total) by mouth 3 (three) times daily.    Dispense:  21 capsule    Refill:  0   Follow-up with your primary care doctor in 5-7 days if not improving, or sooner if symptoms become worse. Precautions discussed. Red flags discussed. Questions invited and answered. Patient voiced understanding and agreement.  Patient verbalized understanding of all instructions given and denies any further questions at this time.   Promote exercise, weight loss, and healthy lifestyle modifications.   I will have the office call you on your glucose and cholesterol results when they return if you have not heard within 1 week please call the office.  This biometric physical is a brief physical and the only labs done are glucose and your lipid panel(cholesterol) and is  not a substitute for seeing a primary care provider for a complete annual physical. Please see a primary care physician for routine health maintenance, labs and full physical at least yearly and follow up as recommended by your provider. Provider also recommends if you do not have a primary care provider for patient to establish care as soon as possible .Patient may chose provider of choice. Also gave the  [facility] at [phone] or web site at [email or web address] to help assist with finding a primary care doctor.  Patient verbalizes understanding that his office is acute care only and not a substitute for a primary care or for the management of chronic conditions.

## 2019-05-27 NOTE — Patient Instructions (Addendum)
Pelvic Ultrasound Complete at 4 pm today - Arrive 20 minutes prior to appointment 3:40pm  [place].  [place]  [phone]  Decrease mountain dews, increase water and hydration with Urinary tract infection.   Follow up with your gynecologist in 1 week, sooner if needed.  Follow up with primary care as well.   Low-Sodium Eating Plan Sodium, which is an element that makes up salt, helps you maintain a healthy balance of fluids in your body. Too much sodium can increase your blood pressure and cause fluid and waste to be held in your body. Your health care provider or dietitian may recommend following this plan if you have high blood pressure (hypertension), kidney disease, liver disease, or heart failure. Eating less sodium can help lower your blood pressure, reduce swelling, and protect your heart, liver, and kidneys. What are tips for following this plan? General guidelines  Most people on this plan should limit their sodium intake to 1,500-2,000 mg (milligrams) of sodium each day. Reading food labels   The Nutrition Facts label lists the amount of sodium in one serving of the food. If you eat more than one serving, you must multiply the listed amount of sodium by the number of servings.  Choose foods with less than 140 mg of sodium per serving.  Avoid foods with 300 mg of sodium or more per serving. Shopping  Look for lower-sodium products, often labeled as "low-sodium" or "no salt added."  Always check the sodium content even if foods are labeled as "unsalted" or "no salt added".  Buy fresh foods. ? Avoid canned foods and premade or frozen meals. ? Avoid canned, cured, or processed meats  Buy breads that have less than 80 mg of sodium per slice. Cooking  Eat more home-cooked food and less restaurant, buffet, and fast food.  Avoid adding salt when cooking. Use salt-free seasonings or herbs instead of table salt or sea salt. Check with your health care provider or  pharmacist before using salt substitutes.  Cook with plant-based oils, such as canola, sunflower, or olive oil. Meal planning  When eating at a restaurant, ask that your food be prepared with less salt or no salt, if possible.  Avoid foods that contain MSG (monosodium glutamate). MSG is sometimes added to [place] food, bouillon, and some canned foods. What foods are recommended? The items listed may not be a complete list. Talk with your dietitian about what dietary choices are best for you. Grains Low-sodium cereals, including oats, puffed wheat and rice, and shredded wheat. Low-sodium crackers. Unsalted rice. Unsalted pasta. Low-sodium bread. Whole-grain breads and whole-grain pasta. Vegetables Fresh or frozen vegetables. "No salt added" canned vegetables. "No salt added" tomato sauce and paste. Low-sodium or reduced-sodium tomato and vegetable juice. Fruits Fresh, frozen, or canned fruit. Fruit juice. Meats and other protein foods Fresh or frozen (no salt added) meat, poultry, seafood, and fish. Low-sodium canned tuna and salmon. Unsalted nuts. Dried peas, beans, and lentils without added salt. Unsalted canned beans. Eggs. Unsalted nut butters. Dairy Milk. Soy milk. Cheese that is naturally low in sodium, such as ricotta cheese, fresh mozzarella, or Swiss cheese Low-sodium or reduced-sodium cheese. Cream cheese. Yogurt. Fats and oils Unsalted butter. Unsalted margarine with no trans fat. Vegetable oils such as canola or olive oils. Seasonings and other foods Fresh and dried herbs and spices. Salt-free seasonings. Low-sodium mustard and ketchup. Sodium-free salad dressing. Sodium-free light mayonnaise. Fresh or refrigerated horseradish. Lemon juice. Vinegar. Homemade, reduced-sodium, or low-sodium soups. Unsalted popcorn and  pretzels. Low-salt or salt-free chips. What foods are not recommended? The items listed may not be a complete list. Talk with your dietitian about what dietary choices  are best for you. Grains Instant hot cereals. Bread stuffing, pancake, and biscuit mixes. Croutons. Seasoned rice or pasta mixes. Noodle soup cups. Boxed or frozen macaroni and cheese. Regular salted crackers. Self-rising flour. Vegetables Sauerkraut, pickled vegetables, and relishes. Olives. [place] fries. Onion rings. Regular canned vegetables (not low-sodium or reduced-sodium). Regular canned tomato sauce and paste (not low-sodium or reduced-sodium). Regular tomato and vegetable juice (not low-sodium or reduced-sodium). Frozen vegetables in sauces. Meats and other protein foods Meat or fish that is salted, canned, smoked, spiced, or pickled. Bacon, ham, sausage, hotdogs, corned beef, chipped beef, packaged lunch meats, salt pork, jerky, pickled herring, anchovies, regular canned tuna, sardines, salted nuts. Dairy Processed cheese and cheese spreads. Cheese curds. Blue cheese. Feta cheese. String cheese. Regular cottage cheese. Buttermilk. Canned milk. Fats and oils Salted butter. Regular margarine. Ghee. Bacon fat. Seasonings and other foods Onion salt, garlic salt, seasoned salt, table salt, and sea salt. Canned and packaged gravies. Worcestershire sauce. Tartar sauce. Barbecue sauce. Teriyaki sauce. Soy sauce, including reduced-sodium. Steak sauce. Fish sauce. Oyster sauce. Cocktail sauce. Horseradish that you find on the shelf. Regular ketchup and mustard. Meat flavorings and tenderizers. Bouillon cubes. Hot sauce and Tabasco sauce. Premade or packaged marinades. Premade or packaged taco seasonings. Relishes. Regular salad dressings. Salsa. Potato and tortilla chips. Corn chips and puffs. Salted popcorn and pretzels. Canned or dried soups. Pizza. Frozen entrees and pot pies. Summary  Eating less sodium can help lower your blood pressure, reduce swelling, and protect your heart, liver, and kidneys.  Most people on this plan should limit their sodium intake to 1,500-2,000 mg (milligrams) of  sodium each day.  Canned, boxed, and frozen foods are high in sodium. Restaurant foods, fast foods, and pizza are also very high in sodium. You also get sodium by adding salt to food.  Try to cook at home, eat more fresh fruits and vegetables, and eat less fast food, canned, processed, or prepared foods. This information is not intended to replace advice given to you by your health care provider. Make sure you discuss any questions you have with your health care provider. Document Released: 03/16/2002 Document Revised: 09/06/2017 Document Reviewed: 09/17/2016 Elsevier Patient Education  2020 [place].  Secondary Amenorrhea  Secondary amenorrhea occurs when a female who was previously having menstrual periods has not had them for 3-6 months. A menstrual period is the monthly shedding of the lining of the uterus. Menstruation involves the passing of blood, tissue, fluid, and mucus through the vagina. The flow of blood usually occurs during 3-7 consecutive days each month. This condition has many causes. In many cases, treating the underlying cause will return menstrual periods back to a normal cycle. What are the causes? The most common cause of this condition is pregnancy. Other causes include:  Malnutrition.  Cirrhosis of the liver.  Conditions of the blood.  Diabetes.  Epilepsy.  Chronic kidney disease.  Polycystic ovary disease.  Stress or anxiety.  A hormonal imbalance.  Ovarian failure.  Medicines.  Extreme obesity.  Cystic fibrosis.  Low body weight or drastic weight loss.  Early menopause.  Removal of the ovaries or uterus.  Contraceptive pills, patches, or vaginal rings.  Cushing syndrome.  Thyroid problems. What increases the risk? You are more likely to develop this condition if:  You have a family history of this  condition.  You have an eating disorder.  You do extreme athletic training.  You have a chronic disease.  You abuse substances  such as alcohol or cigarettes. What are the signs or symptoms? The main symptom of this condition is a lack of menstrual periods for 3-6 months. How is this diagnosed? This condition may be diagnosed based on:  Your medical history.  A physical exam.  A pelvic exam to check for problems with your reproductive organs.  A procedure to examine the uterus.  A measurement of your body mass index (BMI).  Tests, such as: ? Blood tests that measure certain hormones in your body and rule out pregnancy. ? Urine tests. ? Imaging tests, such as an ultrasound, CT scan, or MRI. How is this treated? Treatment for this condition depends on the cause of the amenorrhea. It may involve:  Correcting dietary problems.  Treating underlying conditions.  Medicines.  Lifestyle changes.  Surgery. If the condition cannot be corrected, it is sometimes possible to trigger menstrual periods with medicines. Follow these instructions at home: Lifestyle  Maintain a healthy diet. Ask to meet with a registered dietitian for nutrition counseling and meal planning.  Maintain a healthy weight. Talk to your health care provider before trying any new diet or exercise plan.  Exercise at least 30 minutes 5 or more days each week. Exercising includes brisk walking, yard work, biking, running, swimming, and team sports like basketball and soccer. Ask your health care provider which exercises are safe for you.  Get enough sleep. Plan your sleep time to allow for 7-9 hours of sleep each night.  Learn to manage stress. Explore relaxation techniques such as meditation, journaling, yoga, or tai chi. General instructions  Be aware of changes in your menstrual cycle. Keep a record of when you have your menstrual period. Note the date your period starts, how long it lasts, and any problems you experience.  Take over-the-counter and prescription medicines only as told by your health care provider.  Keep all follow-up  visits as told by your health care provider. This is important. Contact a health care provider if:  Your periods do not return to normal after treatment. Summary  Secondary amenorrhea is when a female who was previously having menstrual periods has not gotten her period for 3-6 months.  This condition has many causes. In many cases, treating the underlying cause will return menstrual periods back to a normal cycle.  Talk to your health care provider if your periods do not return to normal after treatment. This information is not intended to replace advice given to you by your health care provider. Make sure you discuss any questions you have with your health care provider. Document Released: 11/05/2006 Document Revised: 03/06/2018 Document Reviewed: 12/13/2016 Elsevier Patient Education  [place]. Urinary Tract Infection, Adult A urinary tract infection (UTI) is an infection of any part of the urinary tract. The urinary tract includes:  The kidneys.  The ureters.  The bladder.  The urethra. These organs make, store, and get rid of pee (urine) in the body. What are the causes? This is caused by germs (bacteria) in your genital area. These germs grow and cause swelling (inflammation) of your urinary tract. What increases the risk? You are more likely to develop this condition if:  You have a small, thin tube (catheter) to drain pee.  You cannot control when you pee or poop (incontinence).  You are female, and: ? You use these methods to prevent  pregnancy: ? A medicine that kills sperm (spermicide). ? A device that blocks sperm (diaphragm). ? You have low levels of a female hormone (estrogen). ? You are pregnant.  You have genes that add to your risk.  You are sexually active.  You take antibiotic medicines.  You have trouble peeing because of: ? A prostate that is bigger than normal, if you are female. ? A blockage in the part of your body that drains pee from  the bladder (urethra). ? A kidney stone. ? A nerve condition that affects your bladder (neurogenic bladder). ? Not getting enough to drink. ? Not peeing often enough.  You have other conditions, such as: ? Diabetes. ? A weak disease-fighting system (immune system). ? Sickle cell disease. ? Gout. ? Injury of the spine. What are the signs or symptoms? Symptoms of this condition include:  Needing to pee right away (urgently).  Peeing often.  Peeing small amounts often.  Pain or burning when peeing.  Blood in the pee.  Pee that smells bad or not like normal.  Trouble peeing.  Pee that is cloudy.  Fluid coming from the vagina, if you are female.  Pain in the belly or lower back. Other symptoms include:  Throwing up (vomiting).  No urge to eat.  Feeling mixed up (confused).  Being tired and grouchy (irritable).  A fever.  Watery poop (diarrhea). How is this treated? This condition may be treated with:  Antibiotic medicine.  Other medicines.  Drinking enough water. Follow these instructions at home:  Medicines  Take over-the-counter and prescription medicines only as told by your doctor.  If you were prescribed an antibiotic medicine, take it as told by your doctor. Do not stop taking it even if you start to feel better. General instructions  Make sure you: ? Pee until your bladder is empty. ? Do not hold pee for a long time. ? Empty your bladder after sex. ? Wipe from front to back after pooping if you are a female. Use each tissue one time when you wipe.  Drink enough fluid to keep your pee pale yellow.  Keep all follow-up visits as told by your doctor. This is important. Contact a doctor if:  You do not get better after 1-2 days.  Your symptoms go away and then come back. Get help right away if:  You have very bad back pain.  You have very bad pain in your lower belly.  You have a fever.  You are sick to your stomach (nauseous).  You  are throwing up. Summary  A urinary tract infection (UTI) is an infection of any part of the urinary tract.  This condition is caused by germs in your genital area.  There are many risk factors for a UTI. These include having a small, thin tube to drain pee and not being able to control when you pee or poop.  Treatment includes antibiotic medicines for germs.  Drink enough fluid to keep your pee pale yellow. This information is not intended to replace advice given to you by your health care provider. Make sure you discuss any questions you have with your health care provider. Document Released: 03/12/2008 Document Revised: 09/11/2018 Document Reviewed: 04/03/2018 Elsevier Patient Education  [place]. Cephalexin tablets or capsules What is this medicine? CEPHALEXIN (sef a LEX in) is a cephalosporin antibiotic. It is used to treat certain kinds of bacterial infections It will not work for colds, flu, or other viral infections. This medicine may  be used for other purposes; ask your health care provider or pharmacist if you have questions. COMMON BRAND NAME(S): Biocef, Daxbia, Keflex, Keftab What should I tell my health care provider before I take this medicine? They need to know if you have any of these conditions:  kidney disease  stomach or intestine problems, especially colitis  an unusual or allergic reaction to cephalexin, other cephalosporins, penicillins, other antibiotics, medicines, foods, dyes or preservatives  pregnant or trying to get pregnant  breast-feeding How should I use this medicine? Take this medicine by mouth with a full glass of water. Follow the directions on the prescription label. This medicine can be taken with or without food. Take your medicine at regular intervals. Do not take your medicine more often than directed. Take all of your medicine as directed even if you think you are better. Do not skip doses or stop your medicine early. Talk to your  pediatrician regarding the use of this medicine in children. While this drug may be prescribed for selected conditions, precautions do apply. Overdosage: If you think you have taken too much of this medicine contact a poison control center or emergency room at once. NOTE: This medicine is only for you. Do not share this medicine with others. What if I miss a dose? If you miss a dose, take it as soon as you can. If it is almost time for your next dose, take only that dose. Do not take double or extra doses. There should be at least 4 to 6 hours between doses. What may interact with this medicine?  probenecid  some other antibiotics This list may not describe all possible interactions. Give your health care provider a list of all the medicines, herbs, non-prescription drugs, or dietary supplements you use. Also tell them if you smoke, drink alcohol, or use illegal drugs. Some items may interact with your medicine. What should I watch for while using this medicine? Tell your doctor or health care provider if your symptoms do not begin to improve in a few days. This medicine may cause serious skin reactions. They can happen weeks to months after starting the medicine. Contact your health care provider right away if you notice fevers or flu-like symptoms with a rash. The rash may be red or purple and then turn into blisters or peeling of the skin. Or, you might notice a red rash with swelling of the face, lips or lymph nodes in your neck or under your arms. Do not treat diarrhea with over the counter products. Contact your doctor if you have diarrhea that lasts more than 2 days or if it is severe and watery. If you have diabetes, you may get a false-positive result for sugar in your urine. Check with your doctor or health care provider. What side effects may I notice from receiving this medicine? Side effects that you should report to your doctor or health care professional as soon as  possible:  allergic reactions like skin rash, itching or hives, swelling of the face, lips, or tongue  breathing problems  pain or trouble passing urine  redness, blistering, peeling or loosening of the skin, including inside the mouth  severe or watery diarrhea  unusually weak or tired  yellowing of the eyes, skin Side effects that usually do not require medical attention (report to your doctor or health care professional if they continue or are bothersome):  gas or heartburn  genital or anal irritation  headache  joint or muscle pain  nausea,  vomiting This list may not describe all possible side effects. Call your doctor for medical advice about side effects. You may report side effects to FDA at 1-800-FDA-1088. Where should I keep my medicine? Keep out of the reach of children. Store at room temperature between 59 and 86 degrees F (15 and 30 degrees C). Throw away any unused medicine after the expiration date. NOTE: This sheet is a summary. It may not cover all possible information. If you have questions about this medicine, talk to your doctor, pharmacist, or health care provider.  2020 Elsevier/Gold Standard (2019-01-02 07:00:28)  I will have the office call you on your glucose and cholesterol results when they return if you have not heard within 1 week please call the office.  This biometric physical is a brief physical and the only labs done are glucose and your lipid panel(cholesterol) and is  not a substitute for seeing a primary care provider for a complete annual physical. Please see a primary care physician for routine health maintenance, labs and full physical at least yearly and follow up as recommended by your provider. Provider also recommends if you do not have a primary care provider for patient to establish care as soon as possible .Patient may chose provider of choice. Also gave the [place] at [phone] or web site  at [email or web address] to help assist with finding a primary care doctor.  Patient verbalizes understanding that his office is acute care only and not a substitute for a primary care or for the management of chronic conditions.

## 2019-05-28 ENCOUNTER — Encounter: Payer: Self-pay | Admitting: Adult Health

## 2019-05-28 ENCOUNTER — Telehealth: Payer: Self-pay | Admitting: Adult Health

## 2019-05-28 NOTE — Telephone Encounter (Addendum)
Discussed with patient by phone results of Pelvic complete with transvaginal ultrasound. Report also sent to My Chart account , I have sent to your OB GYN per your ok to  office of [facility] as well with the radiologists recommendations included in the report. Melody Shambley CNM My Chart messaged- patients provider  If you have not heard from OBGYN office by next Tuesday I would say give them a call to follow up as this should not wait.   Your TSH is 5.260 High as normal is 0.450-4.500, this could explain your fatigue,weight gain and irregular menstrual cycles. You will need likely treatment for this by your primary care provider and should reach out to Maryland Pink, MD within 1 - 2 weeks.   Your CMP shows normal electrolytes, kidney and liver function, your glucose is 108 - however this is ok as you were not fasting.   Since [facility] was unable to process your CBC, we have put you on the schedule for fasting labs 05/29/19 for 8:30 am and we can do your cholesterol and fasting glucose at that time.  Your urine culture is still pending.   I will send your labs to your primary care provider for treatment of your thyroid. You should follow up by phone or office visit if they prefer on his recommendations within 1-2 weeks.  Once all of your labs release I will send those to your My Chart as well.   Please feel free to reach out to me on MyChart and or call the office 8-4pm Monday to Friday if any questions.   Follow up with primary care as needed for chronic and maintenance health care- can be seen in this employee clinic for acute care.  Patient verbalized understanding of all instructions given and denies any further questions at this time.

## 2019-05-29 ENCOUNTER — Other Ambulatory Visit: Payer: Managed Care, Other (non HMO)

## 2019-05-29 ENCOUNTER — Other Ambulatory Visit: Payer: Self-pay

## 2019-05-29 DIAGNOSIS — N912 Amenorrhea, unspecified: Secondary | ICD-10-CM

## 2019-05-29 DIAGNOSIS — Z0189 Encounter for other specified special examinations: Secondary | ICD-10-CM

## 2019-05-29 DIAGNOSIS — Z008 Encounter for other general examination: Secondary | ICD-10-CM

## 2019-05-29 LAB — COMPREHENSIVE METABOLIC PANEL
ALT: 31 IU/L (ref 0–32)
AST: 35 IU/L (ref 0–40)
Albumin/Globulin Ratio: 1.7 (ref 1.2–2.2)
Albumin: 4.5 g/dL (ref 3.8–4.8)
Alkaline Phosphatase: 98 IU/L (ref 39–117)
BUN/Creatinine Ratio: 13 (ref 9–23)
BUN: 10 mg/dL (ref 6–20)
Bilirubin Total: <0.2 mg/dL (ref 0.0–1.2)
CO2: 23 mmol/L (ref 20–29)
Calcium: 9.4 mg/dL (ref 8.7–10.2)
Chloride: 99 mmol/L (ref 96–106)
Creatinine, Ser: 0.76 mg/dL (ref 0.57–1.00)
GFR calc Af Amer: 117 mL/min/{1.73_m2} (ref >59)
GFR calc non Af Amer: 101 mL/min/{1.73_m2} (ref >59)
Globulin, Total: 2.6 g/dL (ref 1.5–4.5)
Glucose: 108 mg/dL — ABNORMAL HIGH (ref 65–99)
Potassium: 4.1 mmol/L (ref 3.5–5.2)
Sodium: 138 mmol/L (ref 134–144)
Total Protein: 7.1 g/dL (ref 6.0–8.5)

## 2019-05-29 LAB — CBC WITH DIFFERENTIAL/PLATELET

## 2019-05-29 LAB — HCG, SERUM, QUALITATIVE: hCG,Beta Subunit,Qual,Serum: NEGATIVE m[IU]/mL (ref <6)

## 2019-05-29 LAB — TSH: TSH: 5.26 u[IU]/mL — ABNORMAL HIGH (ref 0.450–4.500)

## 2019-05-29 NOTE — Addendum Note (Signed)
Addended by: Judie Petit on: 05/29/2019 08:40 AM   Modules accepted: Orders

## 2019-05-30 LAB — CBC WITH DIFFERENTIAL/PLATELET
Basophils Absolute: 0 10*3/uL (ref 0.0–0.2)
Basos: 1 %
EOS (ABSOLUTE): 0.1 10*3/uL (ref 0.0–0.4)
Eos: 2 %
Hematocrit: 37.2 % (ref 34.0–46.6)
Hemoglobin: 12.4 g/dL (ref 11.1–15.9)
Immature Grans (Abs): 0 10*3/uL (ref 0.0–0.1)
Immature Granulocytes: 0 %
Lymphocytes Absolute: 1.3 10*3/uL (ref 0.7–3.1)
Lymphs: 28 %
MCH: 25.3 pg — ABNORMAL LOW (ref 26.6–33.0)
MCHC: 33.3 g/dL (ref 31.5–35.7)
MCV: 76 fL — ABNORMAL LOW (ref 79–97)
Monocytes Absolute: 0.3 10*3/uL (ref 0.1–0.9)
Monocytes: 7 %
Neutrophils Absolute: 2.8 10*3/uL (ref 1.4–7.0)
Neutrophils: 62 %
Platelets: 268 10*3/uL (ref 150–450)
RBC: 4.91 x10E6/uL (ref 3.77–5.28)
RDW: 14.3 % (ref 11.7–15.4)
WBC: 4.5 10*3/uL (ref 3.4–10.8)

## 2019-05-30 LAB — LIPID PANEL
Chol/HDL Ratio: 3.9 ratio (ref 0.0–4.4)
Cholesterol, Total: 163 mg/dL (ref 100–199)
HDL: 42 mg/dL (ref >39)
LDL Calculated: 107 mg/dL — ABNORMAL HIGH (ref 0–99)
Triglycerides: 72 mg/dL (ref 0–149)
VLDL Cholesterol Cal: 14 mg/dL (ref 5–40)

## 2019-05-30 LAB — URINE CULTURE

## 2019-06-01 ENCOUNTER — Telehealth: Payer: Self-pay | Admitting: Obstetrics and Gynecology

## 2019-06-01 MED ORDER — SULFAMETHOXAZOLE-TRIMETHOPRIM 800-160 MG PO TABS: 1.0000 | ORAL_TABLET | Freq: Two times a day (BID) | ORAL | 0 refills | Status: DC

## 2019-06-01 NOTE — Telephone Encounter (Signed)
The patient called and stated that she needs to speak with Mel or Amy I regards to her needing to know when and what appointments she needs after going to health at work. Please advise.

## 2019-06-01 NOTE — Progress Notes (Signed)
Bactrim antibiotic sent to pharmacy of choice. Patient will start for urine culture results has been on Keflex - not shown to be susceptible to this drug per sensitivity and culture.  CBC recheck in one month, increase iron foods. She will reach out to her gynecology office as soon as possible  for follow up testing as recommended by radiologist office with her recent ultrasound results. She denies any new or changing symptoms.Complete antibiotic and have urine rechecked 1 week after completing or sooner if symptoms persist. Call if any signs of yeast given two antibiotics in recent timeframe.   Thyroid- TSH lab was faxed to Maryland Pink, MD and patient will reach out to him her primary care for follow up and treatment of hypothyroidism as soon as she can.  Advised patient call the office or your primary care doctor for an appointment if no improvement within 72 hours or if any symptoms change or worsen at any time  Advised ER or urgent Care if after hours or on weekend. Call 911 for emergency symptoms at any time.Patinet verbalized understanding of all instructions given/reviewed and treatment plan and has no further questions or concerns at this time.

## 2019-06-02 ENCOUNTER — Other Ambulatory Visit: Payer: Self-pay | Admitting: Adult Health

## 2019-06-03 NOTE — Telephone Encounter (Signed)
pls see imaging, pls advise

## 2019-06-23 ENCOUNTER — Other Ambulatory Visit: Payer: Self-pay | Admitting: Obstetrics and Gynecology

## 2019-06-23 MED ORDER — MEDROXYPROGESTERONE ACETATE 10 MG PO TABS: 10.0000 mg | ORAL_TABLET | Freq: Every day | ORAL | 2 refills | Status: DC

## 2019-06-30 ENCOUNTER — Encounter: Payer: Self-pay | Admitting: *Deleted

## 2019-06-30 ENCOUNTER — Telehealth: Payer: Self-pay | Admitting: Obstetrics and Gynecology

## 2019-06-30 NOTE — Telephone Encounter (Signed)
Sent pt mcm-ac 

## 2019-06-30 NOTE — Telephone Encounter (Signed)
The patient called and stated that she needs to speak with Amy or Melody to determine when the patient needs to come in after stopping the medication she was instructed to do and also needs to know what she needs to schedule, Pt was confused wether or not she needed an ultra sound and follow up apt w/ Mel. Please advise.

## 2019-07-02 ENCOUNTER — Encounter: Payer: Managed Care, Other (non HMO) | Admitting: Obstetrics and Gynecology

## 2019-07-16 ENCOUNTER — Other Ambulatory Visit: Payer: Managed Care, Other (non HMO)

## 2019-07-21 ENCOUNTER — Other Ambulatory Visit: Payer: Self-pay | Admitting: Obstetrics and Gynecology

## 2019-07-21 DIAGNOSIS — N939 Abnormal uterine and vaginal bleeding, unspecified: Secondary | ICD-10-CM

## 2019-07-22 ENCOUNTER — Ambulatory Visit (INDEPENDENT_AMBULATORY_CARE_PROVIDER_SITE_OTHER): Payer: Managed Care, Other (non HMO)

## 2019-07-22 ENCOUNTER — Other Ambulatory Visit: Payer: Self-pay

## 2019-07-22 DIAGNOSIS — N939 Abnormal uterine and vaginal bleeding, unspecified: Secondary | ICD-10-CM | POA: Diagnosis not present

## 2019-08-07 ENCOUNTER — Other Ambulatory Visit: Payer: Self-pay

## 2019-08-07 ENCOUNTER — Ambulatory Visit: Payer: Managed Care, Other (non HMO) | Admitting: Medical

## 2019-08-07 ENCOUNTER — Encounter: Payer: Self-pay | Admitting: Medical

## 2019-08-07 VITALS — BP 135/86 | HR 82 | Resp 18 | Ht 64.0 in | Wt 225.0 lb

## 2019-08-07 DIAGNOSIS — Z008 Encounter for other general examination: Secondary | ICD-10-CM

## 2019-08-07 NOTE — Progress Notes (Signed)
Subjective:    Patient ID: Mary Cooper, female    DOB: [birth year], 38 y.o.   MRN: [id number]  HPI 38 yo in non acute distress. Presents today for Biometric Screening   No Known Allergies   Current Outpatient Medications:  .  furosemide (LASIX) 20 MG tablet, Take by mouth., Disp: , Rfl:  .  hydrOXYzine (ATARAX/VISTARIL) 25 MG tablet, Take 1 tablet (25 mg total) by mouth 3 (three) times daily as needed for anxiety., Disp: 20 tablet, Rfl: 0 .  medroxyPROGESTERone (PROVERA) 10 MG tablet, Take 1 tablet (10 mg total) by mouth daily. Use for ten days, Disp: 10 tablet, Rfl: 2 .  PREVIDENT 5000 BOOSTER PLUS 1.1 % PSTE, USE ONCE DAILY IN PLACE OF REGULAR TOOTHPASTE, Disp: , Rfl:  .  sulfamethoxazole-trimethoprim (BACTRIM DS) 800-160 MG tablet, Take 1 tablet by mouth 2 (two) times daily. Discontinue Keflex follow up one week after completing this antibiotics, Disp: 14 tablet, Rfl: 0 .  venlafaxine XR (EFFEXOR-XR) 37.5 MG 24 hr capsule, Take 75 mg by mouth. , Disp: , Rfl:  Past Medical History:  Diagnosis Date  . Anxiety   . GERD (gastroesophageal reflux disease)    Past Surgical History:  Procedure Laterality Date  . DILATION AND CURETTAGE OF UTERUS  2006  . MOUTH SURGERY    . TUBAL LIGATION N/A 05/03/2016   Procedure: POST PARTUM TUBAL LIGATION;  Surgeon: Rubie Maid, MD;  Location: [facility] ORS;  Service: Gynecology;  Laterality: N/A;   Social History   Socioeconomic History  . Marital status: Single    Spouse name: Not on file  . Number of children: Not on file  . Years of education: Not on file  . Highest education level: Not on file  Occupational History  . Not on file  Social Needs  . Financial resource strain: Not on file  . Food insecurity    Worry: Not on file    Inability: Not on file  . Transportation needs    Medical: Not on file    Non-medical: Not on file  Tobacco Use  . Smoking status: Former Smoker    Packs/day: 0.25    Years: 15.00    Pack years: 3.75     Types: Cigarettes    Quit date: 09/08/2015    Years since quitting: 3.9  . Smokeless tobacco: Never Used  Substance and Sexual Activity  . Alcohol use: Yes    Comment: very rare per pt   . Drug use: No  . Sexual activity: Yes    Birth control/protection: None, Surgical  Lifestyle  . Physical activity    Days per week: Not on file    Minutes per session: Not on file  . Stress: Not on file  Relationships  . Social Herbalist on phone: Not on file    Gets together: Not on file    Attends religious service: Not on file    Active member of club or organization: Not on file    Attends meetings of clubs or organizations: Not on file    Relationship status: Not on file  . Intimate partner violence    Fear of current or ex partner: Not on file    Emotionally abused: Not on file    Physically abused: Not on file    Forced sexual activity: Not on file  Other Topics Concern  . Not on file  Social History Narrative  . Not on file   Family History  Problem Relation Age of Onset  . Cancer Paternal Grandmother        BREAST  . Heart attack Father   . Diabetes Father   . Diabetes Mother      Review of Systems  Constitutional: Negative.   HENT: Negative.   Eyes: Negative.   Respiratory: Negative.   Cardiovascular: Positive for leg swelling (last 3 months L>R. patient seeing PCP has used Lasix for treatment).  Gastrointestinal: Negative.   Endocrine: Negative.   Genitourinary: Negative.   Musculoskeletal: Negative.   Skin: Negative.   Allergic/Immunologic: Positive for environmental allergies.  Neurological: Negative.   Hematological: Bruises/bleeds easily (History of borderline Anemia , no treatment given.).  Psychiatric/Behavioral: The patient is nervous/anxious (under control with Effecxor and Hydroxyzine prn).        Objective:   Physical Exam Vitals signs and nursing note reviewed.  Constitutional:      Appearance: Normal appearance. She is well-developed  and well-groomed. She is obese.  HENT:     Head: Normocephalic and atraumatic.     Jaw: There is normal jaw occlusion.     Right Ear: Hearing, tympanic membrane, ear canal and external ear normal.     Left Ear: Hearing, tympanic membrane, ear canal and external ear normal.     Nose: Nose normal.     Mouth/Throat:     Lips: Pink.     Mouth: Mucous membranes are moist.     Pharynx: Oropharynx is clear.  Eyes:     General: Lids are normal.     Extraocular Movements: Extraocular movements intact.     Conjunctiva/sclera: Conjunctivae normal.     Pupils: Pupils are equal, round, and reactive to light.  Neck:     Musculoskeletal: Normal range of motion and neck supple.     Thyroid: No thyroid mass, thyromegaly or thyroid tenderness.     Trachea: Trachea normal.  Cardiovascular:     Rate and Rhythm: Normal rate and regular rhythm.     Pulses: Normal pulses.          Radial pulses are 2+ on the right side and 2+ on the left side.       Posterior tibial pulses are 2+ on the right side and 2+ on the left side.     Heart sounds: Normal heart sounds.  Pulmonary:     Effort: Pulmonary effort is normal.     Breath sounds: Normal breath sounds.  Abdominal:     General: Abdomen is flat. Bowel sounds are normal.     Palpations: Abdomen is soft.     Tenderness: There is no right CVA tenderness or left CVA tenderness.  Musculoskeletal: Normal range of motion.     Right lower leg: No edema.     Left lower leg: No edema.  Lymphadenopathy:     Head:     Right side of head: No submental, submandibular, tonsillar, preauricular, posterior auricular or occipital adenopathy.     Left side of head: No submental, submandibular, tonsillar, preauricular, posterior auricular or occipital adenopathy.     Cervical: No cervical adenopathy.     Right cervical: No superficial, deep or posterior cervical adenopathy.    Left cervical: No superficial, deep or posterior cervical adenopathy.     Upper Body:      Right upper body: No supraclavicular or epitrochlear adenopathy.     Left upper body: No supraclavicular or epitrochlear adenopathy.  Skin:    General: Skin is warm and dry.  Capillary Refill: Capillary refill takes less than 2 seconds.     Nails: There is no clubbing.   Neurological:     General: No focal deficit present.     Mental Status: She is alert and oriented to person, place, and time.     GCS: GCS eye subscore is 4. GCS verbal subscore is 5. GCS motor subscore is 6.     Cranial Nerves: Cranial nerves are intact.     Sensory: Sensation is intact.     Motor: Motor function is intact.     Coordination: Romberg sign negative. Coordination normal. Finger-Nose-Finger Test and Heel to Avala Test normal.  Psychiatric:        Attention and Perception: Attention and perception normal.        Mood and Affect: Mood and affect normal.        Speech: Speech normal.        Behavior: Behavior normal. Behavior is cooperative.        Thought Content: Thought content normal.        Cognition and Memory: Cognition and memory normal.        Judgment: Judgment normal.    MAEW 5/5 equal grip bilaterally 5/5 upper and lower extremity strength    Recent Results (from the past 2160 hour(s))  Urine Culture     Status: Abnormal   Collection Time: 05/27/19  1:35 PM   Specimen: Urine   URINE  Result Value Ref Range   Urine Culture, Routine Final report (A)    Organism ID, Bacteria Comment (A)     Comment: Staphylococcus epidermidis Greater than 100,000 colony forming units per mL Most isolates of Staphylococcus sp. produce a beta-lactamase enzyme rendering them resistant to penicillin. Please contact the laboratory if penicillin is being considered for therapy.    Antimicrobial Susceptibility Comment     Comment:       ** S = Susceptible; I = Intermediate; R = Resistant **                    P = Positive; N = Negative             MICS are expressed in micrograms per mL    Antibiotic                  RSLT#1    RSLT#2    RSLT#3    RSLT#4 Ciprofloxacin                  S Gentamicin                     S Levofloxacin                   S Linezolid                      S Nitrofurantoin                 S Oxacillin                      R Quinupristin/Dalfopristin      S Rifampin                       S Tetracycline                   S Trimethoprim/Sulfa  S Vancomycin                     S   POCT Urinalysis Dipstick (CPT 81002)     Status: Abnormal   Collection Time: 05/27/19  2:05 PM  Result Value Ref Range   Color, UA Yellow    Clarity, UA Cloudy    Glucose, UA Negative Negative   Bilirubin, UA Negative    Ketones, UA Negative    Spec Grav, UA 1.020 1.010 - 1.025   Blood, UA Negative    pH, UA 7.5 5.0 - 8.0   Protein, UA Positive (A) Negative   Urobilinogen, UA 0.2 0.2 or 1.0 E.U./dL   Nitrite, UA Negative    Leukocytes, UA Trace (A) Negative   Appearance     Odor    POCT Urine Pregnancy (CPT 81025)     Status: None   Collection Time: 05/27/19  2:07 PM  Result Value Ref Range   Preg Test, Ur Negative Negative  TSH     Status: Abnormal   Collection Time: 05/27/19  2:20 PM  Result Value Ref Range   TSH 5.260 (H) 0.450 - 4.500 uIU/mL  CBC with Diff     Status: None   Collection Time: 05/27/19  2:20 PM  Result Value Ref Range   WBC CANCELED x10E3/uL    Comment: Test Not Performed.  Specimen appears to be partially or fully clotted.  Clots may be the result of delayed or insufficient mixing of specimen.  Clot formation may also be temperature related.       Bretlyn W notified 05/29/2019  Result canceled by the ancillary.    RBC CANCELED     Comment: Test not performed  Result canceled by the ancillary.    Hemoglobin CANCELED     Comment: Test not performed  Result canceled by the ancillary.    Hematocrit CANCELED     Comment: Test not performed  Result canceled by the ancillary.    Platelets CANCELED     Comment: Test not  performed  Result canceled by the ancillary.    Neutrophils CANCELED     Comment: Test not performed  Result canceled by the ancillary.    Lymphs CANCELED     Comment: Test not performed  Result canceled by the ancillary.    Monocytes CANCELED     Comment: Test not performed  Result canceled by the ancillary.    Eos CANCELED     Comment: Test not performed  Result canceled by the ancillary.    Lymphocytes Absolute CANCELED     Comment: Test not performed  Result canceled by the ancillary.    EOS (ABSOLUTE) CANCELED     Comment: Test not performed  Result canceled by the ancillary.    Basophils Absolute CANCELED     Comment: Test not performed  Result canceled by the ancillary.   Comp Met (CMET)     Status: Abnormal   Collection Time: 05/27/19  2:20 PM  Result Value Ref Range   Glucose 108 (H) 65 - 99 mg/dL   BUN 10 6 - 20 mg/dL   Creatinine, Ser 0.76 0.57 - 1.00 mg/dL   GFR calc non Af Amer 101 >59 mL/min/1.73   GFR calc Af Amer 117 >59 mL/min/1.73   BUN/Creatinine Ratio 13 9 - 23   Sodium 138 134 - 144 mmol/L   Potassium 4.1 3.5 - 5.2 mmol/L   Chloride 99 96 - 106 mmol/L  CO2 23 20 - 29 mmol/L   Calcium 9.4 8.7 - 10.2 mg/dL   Total Protein 7.1 6.0 - 8.5 g/dL   Albumin 4.5 3.8 - 4.8 g/dL   Globulin, Total 2.6 1.5 - 4.5 g/dL   Albumin/Globulin Ratio 1.7 1.2 - 2.2   Bilirubin Total <0.2 0.0 - 1.2 mg/dL   Alkaline Phosphatase 98 39 - 117 IU/L   AST 35 0 - 40 IU/L   ALT 31 0 - 32 IU/L  hCG, serum, qualitative     Status: None   Collection Time: 05/27/19  2:20 PM  Result Value Ref Range   hCG,Beta Subunit,Qual,Serum Negative Negative <6 mIU/mL  Lipid panel     Status: Abnormal   Collection Time: 05/29/19  8:33 AM  Result Value Ref Range   Cholesterol, Total 163 100 - 199 mg/dL   Triglycerides 72 0 - 149 mg/dL   HDL 42 >39 mg/dL   VLDL Cholesterol Cal 14 5 - 40 mg/dL   LDL Calculated 107 (H) 0 - 99 mg/dL   Chol/HDL Ratio 3.9 0.0 - 4.4 ratio     Comment:                                   T. Chol/HDL Ratio                                             Men  Women                               1/2 Avg.Risk  3.4    3.3                                   Avg.Risk  5.0    4.4                                2X Avg.Risk  9.6    7.1                                3X Avg.Risk 23.4   11.0   CBC with Differential/Platelet     Status: Abnormal   Collection Time: 05/29/19  8:33 AM  Result Value Ref Range   WBC 4.5 3.4 - 10.8 x10E3/uL   RBC 4.91 3.77 - 5.28 x10E6/uL   Hemoglobin 12.4 11.1 - 15.9 g/dL   Hematocrit 37.2 34.0 - 46.6 %   MCV 76 (L) 79 - 97 fL   MCH 25.3 (L) 26.6 - 33.0 pg   MCHC 33.3 31.5 - 35.7 g/dL   RDW 14.3 11.7 - 15.4 %   Platelets 268 150 - 450 x10E3/uL   Neutrophils 62 Not Estab. %   Lymphs 28 Not Estab. %   Monocytes 7 Not Estab. %   Eos 2 Not Estab. %   Basos 1 Not Estab. %   Neutrophils Absolute 2.8 1.4 - 7.0 x10E3/uL   Lymphocytes Absolute 1.3 0.7 - 3.1 x10E3/uL   Monocytes Absolute 0.3 0.1 - 0.9 x10E3/uL   EOS (ABSOLUTE) 0.1 0.0 - 0.4 x10E3/uL  Basophils Absolute 0.0 0.0 - 0.2 x10E3/uL   Immature Granulocytes 0 Not Estab. %   Immature Grans (Abs) 0.0 0.0 - 0.1 x10E3/uL         Assessment & Plan:  Other general examination Biometric Screening Return to clinic as needed. Patient verbalizes understanding and has no questions at discharge.

## 2020-02-21 ENCOUNTER — Other Ambulatory Visit: Payer: Self-pay

## 2020-02-21 ENCOUNTER — Emergency Department
Admission: EM | Admit: 2020-02-21 | Discharge: 2020-02-21 | Disposition: A | Discharge status: Home / Self Care | Payer: Self-pay | Attending: Emergency Medicine | Admitting: Emergency Medicine

## 2020-02-21 ENCOUNTER — Emergency Department: Payer: Self-pay

## 2020-02-21 DIAGNOSIS — M545 Low back pain, unspecified: Secondary | ICD-10-CM

## 2020-02-21 DIAGNOSIS — Z87891 Personal history of nicotine dependence: Secondary | ICD-10-CM | POA: Insufficient documentation

## 2020-02-21 DIAGNOSIS — Z79899 Other long term (current) drug therapy: Secondary | ICD-10-CM | POA: Insufficient documentation

## 2020-02-21 MED ORDER — KETOROLAC TROMETHAMINE 30 MG/ML IJ SOLN
30.0000 mg | Freq: Once | INTRAMUSCULAR | Status: AC
  Administered 2020-02-21: 30 mg via INTRAVENOUS
  Filled 2020-02-21: qty 1

## 2020-02-21 MED ORDER — CYCLOBENZAPRINE HCL 10 MG PO TABS
10.0000 mg | ORAL_TABLET | Freq: Three times a day (TID) | ORAL | 0 refills | Status: DC | PRN
Start: 2020-02-21 — End: 2020-11-07

## 2020-02-21 MED ORDER — LIDOCAINE 5 % EX PTCH
2.0000 | MEDICATED_PATCH | CUTANEOUS | Status: DC
  Administered 2020-02-21: 2 via TRANSDERMAL
  Filled 2020-02-21: qty 2

## 2020-02-21 NOTE — ED Triage Notes (Signed)
Patient c/o lower back pain after doing laundry yesterday. She reports repeatedly bending over and at one point feeling something pop.

## 2020-02-21 NOTE — ED Provider Notes (Signed)
[facility] Emergency Department Provider Note  ____________________________________________   First MD Initiated Contact with Patient 02/21/20 0543     (approximate)  I have reviewed the triage vital signs and the nursing notes.   HISTORY  Chief Complaint Back Pain    HPI Mary Cooper is a 39 y.o. female with below list of previous medical conditions presents to the emergency department secondary to acute onset of low back pain while doing laundry yesterday.  Patient states that after repetitively bending over she suddenly felt a pop in her lower back with ensuing pain which has been persistent since onset.  Patient states that current pain score is 8 out of 10 and described as sharp and aching.  Patient denies any urinary incontinence no bowel habit abnormalities.  Patient denies any lower extremity weakness or numbness.  Patient denies any gait instability.  Patient states that the pain does not radiate into her legs.        Past Medical History:  Diagnosis Date  . Anxiety   . GERD (gastroesophageal reflux disease)     Patient Active Problem List   Diagnosis Date Noted  . 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 DILATION AND CURETTAGE OF UTERUS  2006  . MOUTH SURGERY    . TUBAL LIGATION N/A 05/03/2016   Procedure: POST PARTUM TUBAL LIGATION;  Surgeon: Hildred Laser, MD;  Location: [facility] ORS;  Service: Gynecology;  Laterality: N/A;    Prior to Admission medications   Medication Sig Start Date End Date Taking? Authorizing Provider  cyclobenzaprine (FLEXERIL) 10 MG tablet Take 1 tablet (10 mg total) by mouth 3 (three) times daily as needed. 02/21/20   Darci Current, MD  furosemide (LASIX) 20 MG tablet Take by mouth. 02/24/19 02/24/20  [provider]  hydrOXYzine (ATARAX/VISTARIL) 25 MG tablet Take 1 tablet (25 mg total) by mouth 3 (three) times daily as needed for anxiety. 06/11/18    [name], MD  medroxyPROGESTERone (PROVERA) 10 MG tablet Take 1 tablet (10 mg total) by mouth daily. Use for ten days 06/23/19   Shambley, Melody N, CNM  PREVIDENT 5000 BOOSTER PLUS 1.1 % PSTE USE ONCE DAILY IN PLACE OF REGULAR TOOTHPASTE 03/04/19   [provider]  sulfamethoxazole-trimethoprim (BACTRIM DS) 800-160 MG tablet Take 1 tablet by mouth 2 (two) times daily. Discontinue Keflex follow up one week after completing this antibiotics 06/01/19   Flinchum, Eula Fried, FNP  venlafaxine XR (EFFEXOR-XR) 37.5 MG 24 hr capsule Take 75 mg by mouth.  10/25/18   [provider]    Allergies Patient has no known allergies.  Family History  Problem Relation Age of Onset  . Cancer Paternal Grandmother        BREAST  . Heart attack Father   . Diabetes Father   . Diabetes Mother     Social History Social History   Tobacco Use  . Smoking status: Former Smoker    Packs/day: 0.25    Years: 15.00    Pack years: 3.75    Types: Cigarettes    Quit date: 09/08/2015    Years since quitting: 4.4  . Smokeless tobacco: Never Used  Substance Use Topics  . Alcohol use: Yes    Comment: very rare per pt   . Drug use: No    Review of Systems Constitutional: No fever/chills Eyes: No visual changes. ENT: No sore throat. Cardiovascular: Denies chest pain. Respiratory: Denies shortness of  breath. Gastrointestinal: No abdominal pain.  No nausea, no vomiting.  No diarrhea.  No constipation. Genitourinary: Negative for dysuria. Musculoskeletal: Negative for neck pain.  Positive for back pain. Integumentary: Negative for rash. Neurological: Negative for headaches, focal weakness or numbness.   ____________________________________________   PHYSICAL EXAM:  VITAL SIGNS: ED Triage Vitals  Enc Vitals Group     BP 02/21/20 0427 132/83     Pulse Rate 02/21/20 0427 73     Resp 02/21/20 0427 20     Temp 02/21/20 0427 98.3 F (36.8 C)     Temp src --      SpO2 02/21/20  0427 98 %     Weight 02/21/20 0426 102.1 kg (225 lb 1.4 oz)     Height 02/21/20 0426 1.626 m (5\' 4" )     Head Circumference --      Peak Flow --      Pain Score 02/21/20 0426 8     Pain Loc --      Pain Edu? --      Excl. in GC? --     Constitutional: Alert and oriented.  Eyes: Conjunctivae are normal.  Mouth/Throat: Patient is wearing a mask. Neck: No stridor.  No meningeal signs.   Cardiovascular: Normal rate, regular rhythm. Good peripheral circulation. Grossly normal heart sounds. Respiratory: Normal respiratory effort.  No retractions. Gastrointestinal: Soft and nontender. No distention.  Musculoskeletal: No lower extremity tenderness nor edema. No gross deformities of extremities. Neurologic:  Normal speech and language. No gross focal neurologic deficits are appreciated.  Skin:  Skin is warm, dry and intact. Psychiatric: Mood and affect are normal. Speech and behavior are normal.    RADIOLOGY I, [place] N Urvi Imes, personally viewed and evaluated these images (plain radiographs) as part of my medical decision making, as well as reviewing the written report by the radiologist.  ED MD interpretation: No acute findings levoscoliosis of the thoracolumbar spine per radiologist  Official radiology report(s): DG Lumbar Spine Complete  Result Date: 02/21/2020 CLINICAL DATA:  Low back pain, lifting injury yesterday. EXAM: LUMBAR SPINE - COMPLETE 4+ VIEW COMPARISON:  None. FINDINGS: Levoscoliosis of the thoracolumbar spine, possibly accentuated by patient positioning. No evidence of acute vertebral body subluxation. No fracture line or displaced fracture fragment. No compression fracture deformity. No evidence of pars interarticularis defect seen. Visualized paravertebral soft tissues are unremarkable. IMPRESSION: 1. No acute findings. 2. Levoscoliosis of the thoracolumbar spine, possibly accentuated by patient positioning. Electronically Signed   By: 02/23/2020 M.D.   On: 02/21/2020  07:21      Procedures   ____________________________________________   INITIAL IMPRESSION / MDM / ASSESSMENT AND PLAN / ED COURSE  As part of my medical decision making, I reviewed the following data within the electronic MEDICAL RECORD NUMBER  39 year old female presented with above-stated history and physical exam a differential diagnosis including but not limited to muscle spasm versus lumbar radiculopathy.  Patient given IV Toradol 30 mg in the emergency department with improvement of pain.  X-ray of the lumbar spine revealed possible levoscoliosis however no acute findings.  Patient be prescribed Flexeril for home recommendation to follow-up with orthopedics ____________________________________________  FINAL CLINICAL IMPRESSION(S) / ED DIAGNOSES  Final diagnoses:  Acute right-sided low back pain without sciatica     MEDICATIONS GIVEN DURING THIS VISIT:  Medications  ketorolac (TORADOL) 30 MG/ML injection 30 mg (30 mg Intravenous Given 02/21/20 0615)     ED Discharge Orders  Ordered    cyclobenzaprine (FLEXERIL) 10 MG tablet  3 times daily PRN     02/21/20 3254          *Please note:  Mary Cooper was evaluated in Emergency Department on 02/21/2020 for the symptoms described in the history of present illness. She was evaluated in the context of the global COVID-19 pandemic, which necessitated consideration that the patient might be at risk for infection with the SARS-CoV-2 virus that causes COVID-19. Institutional protocols and algorithms that pertain to the evaluation of patients at risk for COVID-19 are in a state of rapid change based on information released by regulatory bodies including the CDC and federal and state organizations. These policies and algorithms were followed during the patient's care in the ED.  Some ED evaluations and interventions may be delayed as a result of limited staffing during the pandemic.*  Note:  This document was prepared using  Dragon voice recognition software and may include unintentional dictation errors.   Gregor Hams, MD 02/21/20 2148

## 2020-10-13 ENCOUNTER — Encounter: Payer: Medicaid Other | Admitting: Certified Nurse Midwife

## 2020-11-07 ENCOUNTER — Encounter: Payer: Self-pay | Admitting: Certified Nurse Midwife

## 2020-11-07 ENCOUNTER — Other Ambulatory Visit (HOSPITAL_COMMUNITY)
Admission: RE | Admit: 2020-11-07 | Discharge: 2020-11-07 | Disposition: A | Discharge status: Home / Self Care | Payer: Managed Care, Other (non HMO) | Source: Ambulatory Visit | Attending: Certified Nurse Midwife | Admitting: Certified Nurse Midwife

## 2020-11-07 ENCOUNTER — Ambulatory Visit (INDEPENDENT_AMBULATORY_CARE_PROVIDER_SITE_OTHER): Payer: Managed Care, Other (non HMO) | Admitting: Certified Nurse Midwife

## 2020-11-07 ENCOUNTER — Other Ambulatory Visit: Payer: Self-pay

## 2020-11-07 VITALS — BP 137/89 | HR 66 | Ht 64.0 in | Wt 218.1 lb

## 2020-11-07 DIAGNOSIS — Z01419 Encounter for gynecological examination (general) (routine) without abnormal findings: Secondary | ICD-10-CM | POA: Insufficient documentation

## 2020-11-07 DIAGNOSIS — N926 Irregular menstruation, unspecified: Secondary | ICD-10-CM

## 2020-11-07 DIAGNOSIS — Z124 Encounter for screening for malignant neoplasm of cervix: Secondary | ICD-10-CM | POA: Insufficient documentation

## 2020-11-07 NOTE — Progress Notes (Signed)
ANNUAL PREVENTATIVE CARE GYN  ENCOUNTER NOTE  Subjective:       Mary Cooper is a 40 y.o. [phone] female here for a routine annual gynecologic exam.  Current complaints: 1. Irregular menses-last know cycle 01/2020 after leaving stressful job  Denies difficulty breathing or respiratory distress, chest pain, abdominal pain, excessive vaginal bleeding, dysuria, and leg pain or swelling.    Gynecologic History  No LMP recorded (lmp unknown).  Contraception: tubal ligation  Last Pap: 11/2015. Results were: Negative/Negative  Obstetric History  OB History  Gravida Para Term Preterm AB Living  6 4 4   2 4   SAB IAB Ectopic Multiple Live Births  1 1   0 4    # Outcome Date GA Lbr Len/2nd Weight Sex Delivery Anes PTL Lv  6 Term 05/02/16 [redacted]w[redacted]d / 00:07 7 lb 8.3 oz (3.41 kg) F Vag-Spont EPI  LIV  5 Term 06/2015 [redacted]w[redacted]d  7 lb 10 oz (3.459 kg) M    LIV  4 Term 04/2013 [redacted]w[redacted]d  7 lb 13 oz (3.544 kg) M    LIV  3 IAB 2009          2 SAB 2006          1 Term 10/2003 [redacted]w[redacted]d  9 lb 6 oz (4.252 kg) M Vag-Spont   LIV    Past Medical History:  Diagnosis Date  . Anxiety   . GERD (gastroesophageal reflux disease)     Past Surgical History:  Procedure Laterality Date  . DILATION AND CURETTAGE OF UTERUS  2006  . MOUTH SURGERY    . TUBAL LIGATION N/A 05/03/2016   Procedure: POST PARTUM TUBAL LIGATION;  Surgeon: 05/05/2016, MD;  Location: [facility] ORS;  Service: Gynecology;  Laterality: N/A;    Current Outpatient Medications on File Prior to Visit  Medication Sig Dispense Refill  . hydrOXYzine (ATARAX/VISTARIL) 25 MG tablet Take 1 tablet (25 mg total) by mouth 3 (three) times daily as needed for anxiety. 20 tablet 0  . levothyroxine (SYNTHROID) 50 MCG tablet Take 50 mcg by mouth daily before breakfast.    . venlafaxine XR (EFFEXOR-XR) 37.5 MG 24 hr capsule Take 75 mg by mouth.      No current facility-administered medications on file prior to visit.    No Known Allergies  Social History    Socioeconomic History  . Marital status: Single    Spouse name: Not on file  . Number of children: Not on file  . Years of education: Not on file  . Highest education level: Not on file  Occupational History  . Not on file  Tobacco Use  . Smoking status: Former Smoker    Packs/day: 0.25    Years: 15.00    Pack years: 3.75    Types: Cigarettes    Quit date: 09/08/2015    Years since quitting: 5.1  . Smokeless tobacco: Never Used  Vaping Use  . Vaping Use: Never used  Substance and Sexual Activity  . Alcohol use: Yes    Comment: very rare per pt   . Drug use: No  . Sexual activity: Yes    Birth control/protection: None, Surgical    Comment: tubial ligation  Other Topics Concern  . Not on file  Social History Narrative  . Not on file   Social Determinants of Health   Financial Resource Strain: Not on file  Food Insecurity: Not on file  Transportation Needs: Not on file  Physical Activity: Not on file  Stress: Not on file  Social Connections: Not on file  Intimate Partner Violence: Not on file    Family History  Problem Relation Age of Onset  . Cancer Paternal Grandmother        BREAST  . Heart attack Father   . Diabetes Father   . Diabetes Mother     The following portions of the patient's history were reviewed and updated as appropriate: allergies, current medications, past family history, past medical history, past social history, past surgical history and problem list.  Review of Systems  ROS negative except as noted above. Information obtained from patient.    Objective:   BP 137/89   Pulse 66   Ht 5\' 4"  (1.626 m)   Wt 218 lb 1.6 oz (98.9 kg)   LMP  (LMP Unknown)   BMI 37.44 kg/m    CONSTITUTIONAL: Well-developed, well-nourished female in no acute distress.   PSYCHIATRIC: Normal mood and affect. Normal behavior. Normal judgment and thought content.  NEUROLGIC: Alert and oriented to person, place, and time. Normal muscle tone coordination. No  cranial nerve  deficit noted.  HENT:  Normocephalic, atraumatic, External right and left ear normal.  EYES: Conjunctivae and EOM are normal. Pupils are equal and round.   NECK: Normal range of motion, supple, no masses.  Normal thyroid.   SKIN: Skin is warm and dry. No rash noted. Not diaphoretic. No erythema. No pallor. Professional tattoos present.   CARDIOVASCULAR: Normal heart rate noted, regular rhythm, no murmur.  RESPIRATORY: Clear to auscultation bilaterally. Effort and breath sounds normal, no problems with respiration noted.  BREASTS: Symmetric in size. No masses, skin changes, nipple drainage, or lymphadenopathy.  ABDOMEN: Soft, normal bowel sounds, no distention noted.  No tenderness, rebound or guarding.   PELVIC:  External Genitalia: Normal  Vagina: Normal  Cervix: Normal, Pap collected  Uterus: Normal  Adnexa: Normal   MUSCULOSKELETAL: Normal range of motion. No tenderness.  No cyanosis, clubbing, or edema.  2+ distal pulses.  LYMPHATIC: No Axillary, Supraclavicular, or Inguinal Adenopathy.  Assessment:   Annual gynecologic examination 40 y.o.   Contraception: tubal ligation   Obesity 2   Problem List Items Addressed This Visit   None   Visit Diagnoses    Encounter for well woman exam with routine gynecological exam    -  Primary   Relevant Orders   Cytology - PAP   FSH/LH   Estradiol   Progesterone   Pap smear for cervical cancer screening       Relevant Orders   Cytology - PAP   Irregular menses       Relevant Orders   FSH/LH   Estradiol   Progesterone      Plan:   Pap: Pap Co Test   Labs: See orders   Routine preventative health maintenance measures emphasized: Exercise/Diet/Weight control, Tobacco Warnings, Alcohol/Substance use risks, Stress Management and Peer Pressure Issues; see AVS  Reviewed red flag symptoms and when to call  Return to Clinic - 1 Year for 20 or sooner if needed   [employer], CNM  [facility] 11/07/20 10:21 AM

## 2020-11-07 NOTE — Progress Notes (Signed)
Pt present for annual exam. Pt stated that she was doing well other than having irregular cycles. LMP unknown.  Pt declined flu vaccine at this time.

## 2020-11-07 NOTE — Patient Instructions (Addendum)
Pap Test Why am I having this test? A Pap test, also called a Pap smear, is a screening test to check for signs of:  Cancer of the vagina, cervix, and uterus. The cervix is the lower part of the uterus that opens into the vagina.  Infection.  Changes that may be a sign that cancer is developing (precancerous changes). Women need this test on a regular basis. In general, you should have a Pap test every 3 years until you reach menopause or age 39. Women aged 30-60 may choose to have their Pap test done at the same time as an HPV (human papillomavirus) test every 5 years (instead of every 3 years). Your health care provider may recommend having Pap tests more or less often depending on your medical conditions and past Pap test results. What kind of sample is taken? Your health care provider will collect a sample of cells from the surface of your cervix. This will be done using a small cotton swab, plastic spatula, or brush. This sample is often collected during a pelvic exam, when you are lying on your back on an exam table with feet in footrests (stirrups). In some cases, fluids (secretions) from the cervix or vagina may also be collected.   How do I prepare for this test?  Be aware of where you are in your menstrual cycle. If you are menstruating on the day of the test, you may be asked to reschedule.  You may need to reschedule if you have a known vaginal infection on the day of the test.  Follow instructions from your health care provider about: ? Changing or stopping your regular medicines. Some medicines can cause abnormal test results, such as digitalis and tetracycline. ? Avoiding douching or taking a bath the day before or the day of the test. Tell a health care provider about:  Any allergies you have.  All medicines you are taking, including vitamins, herbs, eye drops, creams, and over-the-counter medicines.  Any blood disorders you have.  Any surgeries you have had.  Any  medical conditions you have.  Whether you are pregnant or may be pregnant. How are the results reported? Your test results will be reported as either abnormal or normal. A false-positive result can occur. A false positive is incorrect because it means that a condition is present when it is not. A false-negative result can occur. A false negative is incorrect because it means that a condition is not present when it is. What do the results mean? A normal test result means that you do not have signs of cancer of the vagina, cervix, or uterus. An abnormal result may mean that you have:  Cancer. A Pap test by itself is not enough to diagnose cancer. You will have more tests done in this case.  Precancerous changes in your vagina, cervix, or uterus.  Inflammation of the cervix.  An STD (sexually transmitted disease).  A fungal infection.  A parasite infection. Talk with your health care provider about what your results mean. Questions to ask your health care provider Ask your health care provider, or the department that is doing the test:  When will my results be ready?  How will I get my results?  What are my treatment options?  What other tests do I need?  What are my next steps? Summary  In general, women should have a Pap test every 3 years until they reach menopause or age 29.  Your health care provider will collect  secretions) from the cervix or vagina may also be collected. This information is not intended to replace advice given to you by your health care provider. Make sure you discuss any questions you have with your health care provider. Document Revised: 06/01/2020 Document Reviewed: 05/27/2020 Elsevier Patient Education  2021 Elsevier Inc.   Preventive Care 21-39 Years Old, Female Preventive care refers to lifestyle choices and visits with your health care provider  that can promote health and wellness. This includes: A yearly physical exam. This is also called an annual wellness visit. Regular dental and eye exams. Immunizations. Screening for certain conditions. Healthy lifestyle choices, such as: Eating a healthy diet. Getting regular exercise. Not using drugs or products that contain nicotine and tobacco. Limiting alcohol use. What can I expect for my preventive care visit? Physical exam Your health care provider may check your: Height and weight. These may be used to calculate your BMI (body mass index). BMI is a measurement that tells if you are at a healthy weight. Heart rate and blood pressure. Body temperature. Skin for abnormal spots. Counseling Your health care provider may ask you questions about your: Past medical problems. Family's medical history. Alcohol, tobacco, and drug use. Emotional well-being. Home life and relationship well-being. Sexual activity. Diet, exercise, and sleep habits. Work and work environment. Access to firearms. Method of birth control. Menstrual cycle. Pregnancy history. What immunizations do I need? Vaccines are usually given at various ages, according to a schedule. Your health care provider will recommend vaccines for you based on your age, medical history, and lifestyle or other factors, such as travel or where you work.   What tests do I need? Blood tests Lipid and cholesterol levels. These may be checked every 5 years starting at age 20. Hepatitis C test. Hepatitis B test. Screening Diabetes screening. This is done by checking your blood sugar (glucose) after you have not eaten for a while (fasting). STD (sexually transmitted disease) testing, if you are at risk. BRCA-related cancer screening. This may be done if you have a family history of breast, ovarian, tubal, or peritoneal cancers. Pelvic exam and Pap test. This may be done every 3 years starting at age 21. Starting at age 30, this may  be done every 5 years if you have a Pap test in combination with an HPV test. Talk with your health care provider about your test results, treatment options, and if necessary, the need for more tests.   Follow these instructions at home: Eating and drinking Eat a healthy diet that includes fresh fruits and vegetables, whole grains, lean protein, and low-fat dairy products. Take vitamin and mineral supplements as recommended by your health care provider. Do not drink alcohol if: Your health care provider tells you not to drink. You are pregnant, may be pregnant, or are planning to become pregnant. If you drink alcohol: Limit how much you have to 0-1 drink a day. Be aware of how much alcohol is in your drink. In the [place] one drink equals one 12 oz bottle of beer (355 mL), one 5 oz glass of wine (148 mL), or one 1 oz glass of hard liquor (44 mL).   Lifestyle Take daily care of your teeth and gums. Brush your teeth every morning and night with fluoride toothpaste. Floss one time each day. Stay active. Exercise for at least 30 minutes 5 or more days each week. Do not use any products that contain nicotine or tobacco, such as cigarettes, e-cigarettes, and chewing tobacco. If   or one 1 oz glass of hard liquor (44 mL).   Lifestyle  Take daily care of your teeth and gums. Brush your teeth every morning and night with fluoride toothpaste. Floss one time each day.  Stay active. Exercise for at least 30 minutes 5 or more days each week.  Do not use any products that contain nicotine or tobacco, such as cigarettes, e-cigarettes, and chewing tobacco. If you need help quitting, ask your health care provider.  Do not use drugs.  If you are sexually active, practice safe sex. Use a condom or other form of protection to prevent STIs (sexually transmitted infections).  If you do not wish to become pregnant, use a form of birth control. If you plan to become pregnant, see your health care provider for a prepregnancy visit.  Find healthy ways to cope with stress, such as: ? Meditation, yoga, or listening to music. ? Journaling. ? Talking to a trusted person. ? Spending time with friends and family. Safety  Always wear your seat belt while  driving or riding in a vehicle.  Do not drive: ? If you have been drinking alcohol. Do not ride with someone who has been drinking. ? When you are tired or distracted. ? While texting.  Wear a helmet and other protective equipment during sports activities.  If you have firearms in your house, make sure you follow all gun safety procedures.  Seek help if you have been physically or sexually abused. What's next?  Go to your health care provider once a year for an annual wellness visit.  Ask your health care provider how often you should have your eyes and teeth checked.  Stay up to date on all vaccines. This information is not intended to replace advice given to you by your health care provider. Make sure you discuss any questions you have with your health care provider. Document Revised: 05/22/2020 Document Reviewed: 06/05/2018 Elsevier Patient Education  2021 [place] Breast self-awareness is knowing how your breasts look and feel. Doing breast self-awareness is important. It allows you to catch a breast problem early while it is still small and can be treated. All women should do breast self-awareness, including women who have had breast implants. Tell your doctor if you notice a change in your breasts. What you need:  A mirror.  A well-lit room. How to do a breast self-exam A breast self-exam is one way to learn what is normal for your breasts and to check for changes. To do a breast self-exam: Look for changes 1. Take off all the clothes above your waist. 2. Stand in front of a mirror in a room with good lighting. 3. Put your hands on your hips. 4. Push your hands down. 5. Look at your breasts and nipples in the mirror to see if one breast or nipple looks different from the other. Check to see if: ? The shape of one breast is different. ? The size of one breast is different. ? There are wrinkles, dips, and bumps in one breast and not the  other. 6. Look at each breast for changes in the skin, such as: ? Redness. ? Scaly areas. 7. Look for changes in your nipples, such as: ? Liquid around the nipples. ? Bleeding. ? Dimpling. ? Redness. ? A change in where the nipples are.   Feel for changes 1. Lie on your back on the floor. 2. Feel each breast. To do this, follow these steps: ? Pick a  breast to feel. ? Put the arm closest to that breast above your head. ? Use your other arm to feel the nipple area of your breast. Feel the area with the pads of your three middle fingers by making small circles with your fingers. For the first circle, press lightly. For the second circle, press harder. For the third circle, press even harder. ? Keep making circles with your fingers at the different pressures as you move down your breast. Stop when you feel your ribs. ? Move your fingers a little toward the center of your body. ? Start making circles with your fingers again, this time going up until you reach your collarbone. ? Keep making up-and-down circles until you reach your armpit. Remember to keep using the three pressures. ? Feel the other breast in the same way. 3. Sit or stand in the tub or shower. 4. With soapy water on your skin, feel each breast the same way you did in step 2 when you were lying on the floor.   Write down what you find Writing down what you find can help you remember what to tell your doctor. Write down:  What is normal for each breast.  Any changes you find in each breast, including: ? The kind of changes you find. ? Whether you have pain. ? Size and location of any lumps.  When you last had your menstrual period. General tips  Check your breasts every month.  If you are breastfeeding, the best time to check your breasts is after you feed your baby or after you use a breast pump.  If you get menstrual periods, the best time to check your breasts is 5-7 days after your menstrual period is over.  With  time, you will become comfortable with the self-exam, and you will begin to know if there are changes in your breasts. Contact a doctor if you:  See a change in the shape or size of your breasts or nipples.  See a change in the skin of your breast or nipples, such as red or scaly skin.  Have fluid coming from your nipples that is not normal.  Find a lump or thick area that was not there before.  Have pain in your breasts.  Have any concerns about your breast health. Summary  Breast self-awareness includes looking for changes in your breasts, as well as feeling for changes within your breasts.  Breast self-awareness should be done in front of a mirror in a well-lit room.  You should check your breasts every month. If you get menstrual periods, the best time to check your breasts is 5-7 days after your menstrual period is over.  Let your doctor know of any changes you see in your breasts, including changes in size, changes on the skin, pain or tenderness, or fluid from your nipples that is not normal. This information is not intended to replace advice given to you by your health care provider. Make sure you discuss any questions you have with your health care provider. Document Revised: 05/13/2018 Document Reviewed: 05/13/2018 Elsevier Patient Education  [place].

## 2020-11-08 LAB — ESTRADIOL: Estradiol: 51.8 pg/mL

## 2020-11-08 LAB — FSH/LH
FSH: 6.8 m[IU]/mL
LH: 14.2 m[IU]/mL

## 2020-11-08 LAB — PROGESTERONE: Progesterone: <0.1 ng/mL

## 2020-11-11 ENCOUNTER — Encounter: Payer: Self-pay | Admitting: Certified Nurse Midwife

## 2020-11-11 DIAGNOSIS — R87618 Other abnormal cytological findings on specimens from cervix uteri: Secondary | ICD-10-CM | POA: Insufficient documentation

## 2020-11-11 LAB — CYTOLOGY - PAP
Comment: NEGATIVE
Comment: NEGATIVE
Diagnosis: NEGATIVE
HPV 16: NEGATIVE
HPV 18 / 45: NEGATIVE
High risk HPV: POSITIVE — AB

## 2020-12-06 ENCOUNTER — Other Ambulatory Visit: Payer: Self-pay

## 2020-12-06 DIAGNOSIS — N926 Irregular menstruation, unspecified: Secondary | ICD-10-CM

## 2020-12-08 ENCOUNTER — Other Ambulatory Visit: Payer: Managed Care, Other (non HMO)

## 2020-12-08 ENCOUNTER — Encounter: Payer: Managed Care, Other (non HMO) | Admitting: Certified Nurse Midwife

## 2020-12-16 ENCOUNTER — Other Ambulatory Visit: Payer: Self-pay

## 2020-12-16 ENCOUNTER — Ambulatory Visit
Admission: RE | Admit: 2020-12-16 | Discharge: 2020-12-16 | Disposition: A | Discharge status: Home / Self Care | Payer: Managed Care, Other (non HMO) | Source: Ambulatory Visit | Attending: Certified Nurse Midwife | Admitting: Certified Nurse Midwife

## 2020-12-16 ENCOUNTER — Encounter: Payer: Self-pay | Admitting: Surgical

## 2020-12-16 ENCOUNTER — Encounter: Payer: Managed Care, Other (non HMO) | Admitting: Certified Nurse Midwife

## 2020-12-16 DIAGNOSIS — N926 Irregular menstruation, unspecified: Secondary | ICD-10-CM | POA: Insufficient documentation

## 2021-04-23 ENCOUNTER — Encounter: Payer: Self-pay | Admitting: Emergency Medicine

## 2021-04-23 ENCOUNTER — Other Ambulatory Visit: Payer: Self-pay

## 2021-04-23 ENCOUNTER — Emergency Department
Admission: EM | Admit: 2021-04-23 | Discharge: 2021-04-23 | Disposition: A | Discharge status: Home / Self Care | Payer: Medicaid Other | Attending: Emergency Medicine | Admitting: Emergency Medicine

## 2021-04-23 DIAGNOSIS — Z87891 Personal history of nicotine dependence: Secondary | ICD-10-CM | POA: Insufficient documentation

## 2021-04-23 DIAGNOSIS — K0889 Other specified disorders of teeth and supporting structures: Secondary | ICD-10-CM

## 2021-04-23 MED ORDER — LIDOCAINE VISCOUS HCL 2 % MT SOLN
15.0000 mL | Freq: Once | OROMUCOSAL | Status: AC
  Administered 2021-04-23: 15 mL via OROMUCOSAL
  Filled 2021-04-23: qty 15

## 2021-04-23 MED ORDER — IBUPROFEN 600 MG PO TABS
600.0000 mg | ORAL_TABLET | Freq: Once | ORAL | Status: AC
  Administered 2021-04-23: 600 mg via ORAL
  Filled 2021-04-23: qty 1

## 2021-04-23 MED ORDER — AMOXICILLIN-POT CLAVULANATE 875-125 MG PO TABS
1.0000 | ORAL_TABLET | Freq: Once | ORAL | Status: AC
  Administered 2021-04-23: 1 via ORAL
  Filled 2021-04-23: qty 1

## 2021-04-23 MED ORDER — AMOXICILLIN-POT CLAVULANATE 875-125 MG PO TABS
1.0000 | ORAL_TABLET | Freq: Two times a day (BID) | ORAL | 0 refills | Status: AC
Start: 2021-04-23 — End: 2021-04-30

## 2021-04-23 MED ORDER — LIDOCAINE VISCOUS HCL 2 % MT SOLN
15.0000 mL | OROMUCOSAL | 0 refills | Status: DC | PRN
Start: 2021-04-23 — End: 2021-11-29

## 2021-04-23 NOTE — ED Triage Notes (Signed)
Pt comes into the ED via POV c/o right side dental pain that started a couple days ago.  Pt in NAD.

## 2021-04-23 NOTE — Discharge Instructions (Addendum)
Please take the antibiotic as prescribed.  You may use the topical viscous lidocaine as needed in conjunction with Tylenol and ibuprofen for your pain.  Return to the emergency department if you experience any fever, chills.  Otherwise, follow-up with your established dentist outpatient.

## 2021-04-23 NOTE — ED Provider Notes (Signed)
[facility] Emergency Department Provider Note  ____________________________________________   Event Date/Time   First MD Initiated Contact with Patient 04/23/21 [phone]     (approximate)  I have reviewed the triage vital signs and the nursing notes.   HISTORY  Chief Complaint Dental Pain   HPI Mary Cooper is a 40 y.o. female who presents to the ER for evaluation of right lower dental pain that woke her up acute in the middle of the night.  Patient states that she has a known tooth that needs removal on the right upper and right lower has a crown needing repair.  She is already established with a local dentist.  She has been using Tylenol, ice packs and Orajel with no relief in her symptoms.  She denies any fever, chills, pain under the tongue or other systemic symptoms.       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 Authorizing Provider  amoxicillin-clavulanate (AUGMENTIN) 875-125 MG tablet Take 1 tablet by mouth 2 (two) times daily for 7 days. 04/23/21 04/30/21 Yes Melodee Lupe, Ruben Gottron, PA  lidocaine (XYLOCAINE) 2 % solution Use as directed 15 mLs in the mouth or throat as needed for mouth pain. 04/23/21  Yes Lucy Chris, PA  hydrOXYzine (ATARAX/VISTARIL) 25 MG tablet Take 1 tablet (25 mg total) by mouth 3 (three) times daily as needed for anxiety. 06/11/18   [name], MD   levothyroxine (SYNTHROID) 50 MCG tablet Take 50 mcg by mouth daily before breakfast.    [provider]  venlafaxine XR (EFFEXOR-XR) 37.5 MG 24 hr capsule Take 75 mg by mouth.  10/25/18   [provider]    Allergies Patient has no known allergies.  Family History  Problem Relation Age of Onset   Cancer Paternal Grandmother        BREAST   Heart attack Father    Diabetes Father    Diabetes Mother     Social History Social History   Tobacco Use   Smoking status: Former    Packs/day: 0.25    Years: 15.00    Pack years: 3.75    Types: Cigarettes    Quit date: 09/08/2015    Years since quitting: 5.6   Smokeless tobacco: Never  Vaping Use   Vaping Use: Never used  Substance Use Topics   Alcohol use: Yes    Comment: very rare per pt    Drug use: No    Review of Systems Constitutional: No fever/chills Eyes: No visual changes. ENT: + Dental pain, no sore throat. Cardiovascular: Denies chest pain. Respiratory: Denies shortness of breath. Gastrointestinal: No abdominal pain.  No nausea, no vomiting.  No diarrhea.  No constipation. Genitourinary: Negative for dysuria. Musculoskeletal: Negative for back pain. Skin: Negative for rash. Neurological: Negative for headaches, focal weakness or numbness.  ____________________________________________   PHYSICAL EXAM:  VITAL SIGNS:  ED Triage Vitals  Enc Vitals Group     BP 04/23/21 0332 132/78     Pulse Rate 04/23/21 0332 70     Resp 04/23/21 0332 16     Temp 04/23/21 0332 98 F (36.7 C)     Temp Source 04/23/21 0332 Oral     SpO2 04/23/21 0332 97 %     Weight 04/23/21 0318 218 lb 0.6 oz (98.9 kg)     Height 04/23/21 0318 5\' 4"  (1.626 m)     Head Circumference --      Peak Flow --      Pain Score 04/23/21 0318 9     Pain Loc --      Pain Edu? --      Excl. in GC? --    Constitutional: Alert and oriented. Well appearing and in no acute distress. Eyes: Conjunctivae are normal. PERRL. EOMI. Head:  Atraumatic. Nose: No congestion/rhinnorhea. Mouth/Throat: There is a broken tooth on the right upper with no.  Gingival swelling or abscess.  Tooth with crown on the right lower has mild.  Gingival swelling, no area of fluctuance or clear abscess.  No tenderness under the tongue, no lymphadenopathy.  Mucous membranes are moist.  Oropharynx non-erythematous. Neck: No stridor.   Cardiovascular: Normal rate, regular rhythm. Grossly normal heart sounds.  Good peripheral circulation. Respiratory: Normal respiratory effort.  No retractions. Lungs CTAB. Neurologic:  Normal speech and language. No gross focal neurologic deficits are appreciated. No gait instability. Skin:  Skin is warm, dry and intact. No rash noted. Psychiatric: Mood and affect are normal. Speech and behavior are normal.   ____________________________________________   INITIAL IMPRESSION / ASSESSMENT AND PLAN / ED COURSE  As part of my medical decision making, I reviewed the following data within the electronic MEDICAL RECORD NUMBER Nursing notes reviewed and incorporated and Notes from prior ED visits        Patient is a 40 year old female who presents to the emergency department for evaluation of dental pain that began acutely last night unrelieved by Tylenol, ice packs and Orajel.  See HPI for further details.  In triage patient has normal vital signs.  Physical exam as above.  We will begin course of antibiotics for right-sided dental infection.  She states she was just on amoxicillin approximately 6 weeks ago for a left sided dental infection that was treated by her dentist.  She did complete this course of antibiotics.  Given this was recent, we will proceed with Augmentin.  Recommended analgesia with Tylenol, ibuprofen, viscous lidocaine.  Patient already has an established dentist to follow-up with, recommended calling his office tomorrow to schedule follow-up.  The patient is amenable with this plan, return precautions were  discussed and she stable this time for outpatient follow-up.      ____________________________________________   FINAL CLINICAL IMPRESSION(S) / ED DIAGNOSES  Final diagnoses:  Pain, dental     ED Discharge Orders          Ordered    amoxicillin-clavulanate (AUGMENTIN) 875-125 MG tablet  2 times daily        04/23/21 0738    lidocaine (XYLOCAINE) 2 % solution  As needed        04/23/21 04/25/21             Note:  This document was prepared using Dragon voice recognition software and may include unintentional dictation errors.    [id number], PA 04/23/21 04/25/21    [id number], MD 04/23/21 [phone]

## 2021-11-09 ENCOUNTER — Encounter: Payer: Medicaid Other | Admitting: Certified Nurse Midwife

## 2021-11-16 ENCOUNTER — Encounter: Payer: Medicaid Other | Admitting: Obstetrics

## 2021-11-29 ENCOUNTER — Other Ambulatory Visit (HOSPITAL_COMMUNITY)
Admission: RE | Admit: 2021-11-29 | Discharge: 2021-11-29 | Disposition: A | Discharge status: Home / Self Care | Payer: Medicaid Other | Source: Ambulatory Visit | Attending: Obstetrics | Admitting: Obstetrics

## 2021-11-29 ENCOUNTER — Other Ambulatory Visit: Payer: Self-pay

## 2021-11-29 ENCOUNTER — Ambulatory Visit (INDEPENDENT_AMBULATORY_CARE_PROVIDER_SITE_OTHER): Payer: Medicaid Other | Admitting: Obstetrics

## 2021-11-29 ENCOUNTER — Encounter: Payer: Self-pay | Admitting: Obstetrics

## 2021-11-29 VITALS — BP 130/83 | Ht 64.0 in | Wt 216.0 lb

## 2021-11-29 DIAGNOSIS — Z01419 Encounter for gynecological examination (general) (routine) without abnormal findings: Secondary | ICD-10-CM | POA: Diagnosis not present

## 2021-11-29 DIAGNOSIS — Z1231 Encounter for screening mammogram for malignant neoplasm of breast: Secondary | ICD-10-CM | POA: Diagnosis not present

## 2021-11-29 NOTE — Progress Notes (Signed)
SUBJECTIVE  HPI  Mary Cooper is a 41 y.o.-year-old female who presents for an annual physical today. She is also here for a Pap since last year her Pap showed HPV. She is feeling well overall. Her periods have returned to normal since quitting a very stressful job and have been regular for approximately the past 6-12 months. She is working on decreasing her Effexor dose with her PCP. Her PCP also manages her thyroid. She has no other concerns today.  Medical/Surgical History Past Medical History:  Diagnosis Date   Anxiety    GERD (gastroesophageal reflux disease)    Past Surgical History:  Procedure Laterality Date   DILATION AND CURETTAGE OF UTERUS  2006   MOUTH SURGERY     TUBAL LIGATION N/A 05/03/2016   Procedure: POST PARTUM TUBAL LIGATION;  Surgeon: Rubie Maid, MD;  Location: [facility] ORS;  Service: Gynecology;  Laterality: N/A;    Social History Lives with husband and children Works at a [employer] occasionally. Working on it. Substances: Former smoker  Obstetric History OB History     Gravida  6   Para  4   Term  4   Preterm      AB  2   Living  4      SAB  1   IAB  1   Ectopic      Multiple  0   Live Births  4            GYN/Menstrual History Patient's last menstrual period was 10/29/2021 (exact date). Regular monthly periods Last Pap: 10/2020. +HPV Contraception: BTL  Prevention Endorses regular dental exams Encouraged routine eye exam Wants to start mammograms at 1. Flu shot/vaccines: declines  Current Medications Outpatient Medications Prior to Visit  Medication Sig   levothyroxine (SYNTHROID) 50 MCG tablet Take 50 mcg by mouth daily before breakfast.   venlafaxine XR (EFFEXOR-XR) 37.5 MG 24 hr capsule Take 75 mg by mouth.    [DISCONTINUED] hydrOXYzine (ATARAX/VISTARIL) 25 MG tablet Take 1 tablet (25 mg total) by mouth 3 (three) times daily as needed for anxiety.   [DISCONTINUED] lidocaine (XYLOCAINE) 2 %  solution Use as directed 15 mLs in the mouth or throat as needed for mouth pain.   No facility-administered medications prior to visit.      ROS History obtained from the patient General ROS: negative for - chills, fatigue, or malaise Psychological ROS: positive for - anxiety negative for - depression Endocrine ROS: negative for - breast changes, hot flashes, or palpitations Breast ROS: negative for breast lumps Respiratory ROS: no cough, shortness of breath, or wheezing Cardiovascular ROS: no chest pain or dyspnea on exertion Gastrointestinal ROS: no abdominal pain, change in bowel habits, or black or bloody stools Genito-Urinary ROS: no dysuria, trouble voiding, or hematuria negative for - genital discharge Musculoskeletal ROS: negative for - joint pain or muscle pain  Depression screen [facility] 2/9 06/05/2016 07/20/2015  Decreased Interest 0 0  Down, Depressed, Hopeless 2 0  PHQ - 2 Score 2 0  Altered sleeping 0 -  Tired, decreased energy 1 -  Change in appetite 0 -  Feeling bad or failure about yourself  0 -  Trouble concentrating 0 -  Moving slowly or fidgety/restless 0 -  Suicidal thoughts 0 -  PHQ-9 Score 3 -     OBJECTIVE  Last Weight  Most recent update: 11/29/2021  8:56 AM    Weight  98 kg (216 lb)  Body mass index is 37.08 kg/m.    BP 130/83    Ht 5\' 4"  (1.626 m)    Wt 216 lb (98 kg)    LMP 10/29/2021 (Exact Date)    BMI 37.08 kg/m  General appearance: alert, cooperative, and appears stated age Head: Normocephalic, without obvious abnormality, atraumatic Neck: no adenopathy, supple, symmetrical, trachea midline, and thyroid not enlarged, symmetric, no tenderness/mass/nodules Lungs: clear to auscultation bilaterally Breasts: normal appearance, no masses or tenderness, Inspection negative, No nipple retraction or dimpling, Normal to palpation without dominant masses Heart: regular rate and rhythm, S1, S2 normal, no murmur, click, rub or  gallop Abdomen: soft, non-tender; bowel sounds normal; no masses,  no organomegaly Pelvic: cervix normal in appearance, external genitalia normal, no cervical motion tenderness, vagina normal without discharge, and Pap collected Extremities: extremities normal, atraumatic, no cyanosis or edema Pulses: 2+ and symmetric Skin: Skin color, texture, turgor normal. No rashes or lesions Lymph nodes: Cervical, supraclavicular, and axillary nodes normal.  ASSESSMENT  1) Annual exam 2) Positive HPV on year ago 3) Preventive care 4) Heavy bleeding on days 2-3 of period PLAN 1) Physical exam as noted 2) Pap collected; f/u based on results 3) Mammogram ordered. Encouraged exercise, healthy eating habits. 4) Recommend ibuprofen 800 mg TID for first 3 days of menses Return in one year for annual exam or as needed for concerns.  Lloyd Huger, CNM

## 2021-11-29 NOTE — Patient Instructions (Signed)
For heavy menstrual bleeding: Take 800 mg of ibuprofen every 8 hours on a schedule during the first three days of your period

## 2021-12-07 ENCOUNTER — Encounter: Payer: Self-pay | Admitting: Obstetrics

## 2021-12-07 LAB — CYTOLOGY - PAP
Comment: NEGATIVE
Comment: NEGATIVE
Diagnosis: UNDETERMINED — AB
HPV 16: NEGATIVE
HPV 18 / 45: NEGATIVE
High risk HPV: POSITIVE — AB

## 2022-01-11 ENCOUNTER — Encounter: Payer: Medicaid Other | Admitting: Obstetrics and Gynecology

## 2022-02-08 ENCOUNTER — Encounter: Payer: Medicaid Other | Admitting: Obstetrics and Gynecology

## 2022-02-21 ENCOUNTER — Encounter: Payer: Medicaid Other | Admitting: Obstetrics and Gynecology

## 2022-03-08 ENCOUNTER — Encounter: Payer: Medicaid Other | Admitting: Obstetrics and Gynecology

## 2022-03-22 ENCOUNTER — Encounter: Payer: Medicaid Other | Admitting: Obstetrics and Gynecology

## 2022-05-01 ENCOUNTER — Other Ambulatory Visit (HOSPITAL_COMMUNITY)
Admission: RE | Admit: 2022-05-01 | Discharge: 2022-05-01 | Disposition: A | Discharge status: Home / Self Care | Payer: Medicaid Other | Source: Ambulatory Visit | Attending: Obstetrics and Gynecology | Admitting: Obstetrics and Gynecology

## 2022-05-01 ENCOUNTER — Ambulatory Visit (INDEPENDENT_AMBULATORY_CARE_PROVIDER_SITE_OTHER): Payer: Medicaid Other | Admitting: Obstetrics and Gynecology

## 2022-05-01 ENCOUNTER — Encounter: Payer: Self-pay | Admitting: Obstetrics and Gynecology

## 2022-05-01 VITALS — BP 125/82 | HR 68 | Ht 64.0 in | Wt 216.4 lb

## 2022-05-01 DIAGNOSIS — R87618 Other abnormal cytological findings on specimens from cervix uteri: Secondary | ICD-10-CM

## 2022-05-01 NOTE — Progress Notes (Signed)
     GYNECOLOGY OFFICE COLPOSCOPY PROCEDURE NOTE  41 y.o. [id number] here for colposcopy for  showing atypical squamous cells of undetermined significance (ASCUS) and +HPV  pap smear on 11/29/21. Discussed role for HPV in cervical dysplasia, need for surveillance.  Patient gave informed written consent, time out was performed.  Placed in lithotomy position. Cervix viewed with speculum and colposcope after application of acetic acid.   Colposcopy adequate? Yes  Findings: no visible lesions, no mosaicism, no punctation, and no abnormal vasculature; no corresponding biopsies obtained.  ECC specimen obtained. All specimens were labeled and sent to pathology.  Chaperone was present during entire procedure.  Patient was given post procedure instructions.  Will follow up pathology and manage accordingly; patient will be contacted with results and recommendations.  Routine preventative health maintenance measures emphasized.    Hildred Laser, MD [facility]

## 2022-05-02 LAB — SURGICAL PATHOLOGY

## 2022-11-30 ENCOUNTER — Encounter: Payer: Self-pay | Admitting: Obstetrics

## 2022-12-04 ENCOUNTER — Ambulatory Visit: Payer: Medicaid Other | Admitting: Obstetrics

## 2023-01-01 NOTE — Progress Notes (Deleted)
GYNECOLOGY ANNUAL PHYSICAL EXAM PROGRESS NOTE  Subjective:    Mary Cooper is a 42 y.o. [phone] female who presents for an annual exam. The patient has no complaints today. The patient {is/is not/has never been:13135} sexually active. The patient participates in regular exercise: {yes/no/not asked:9010}. Has the patient ever been transfused or tattooed?: {yes/no/not asked:9010}. The patient reports that there {is/is not:9024} domestic violence in her life.    Menstrual History: Menarche age: *** No LMP recorded.     Gynecologic History:  Contraception: tubal ligation History of STI's:  Last Pap: 11/29/2021. Results were: abnormal. Notes h/o abnormal pap smears. Last mammogram: Due this year for the first time.       OB History  Gravida Para Term Preterm AB Living  6 4 4  0 2 4  SAB IAB Ectopic Multiple Live Births  1 1 0 0 4    # Outcome Date GA Lbr Len/2nd Weight Sex Delivery Anes PTL Lv  6 Term 05/02/16 [redacted]w[redacted]d / 00:07 7 lb 8.3 oz (3.41 kg) F Vag-Spont EPI  LIV     Name: Kliebert,PENDINGBABY     Apgar1: 8  Apgar5: 9  5 Term 06/2015 [redacted]w[redacted]d  7 lb 10 oz (3.459 kg) M    LIV  4 Term 04/2013 [redacted]w[redacted]d  7 lb 13 oz (3.544 kg) M    LIV  3 IAB 2009          2 SAB 2006          1 Term 10/2003 [redacted]w[redacted]d  9 lb 6 oz (4.252 kg) M Vag-Spont   LIV    Past Medical History:  Diagnosis Date   Anxiety    GERD (gastroesophageal reflux disease)     Past Surgical History:  Procedure Laterality Date   DILATION AND CURETTAGE OF UTERUS  2006   MOUTH SURGERY     TUBAL LIGATION N/A 05/03/2016   Procedure: POST PARTUM TUBAL LIGATION;  Surgeon: Rubie Maid, MD;  Location: [facility] ORS;  Service: Gynecology;  Laterality: N/A;    Family History  Problem Relation Age of Onset   Cancer Paternal Grandmother        BREAST   Heart attack Father    Diabetes Father    Diabetes Mother     Social History   Socioeconomic History   Marital status: Single    Spouse name: Not on file   Number of  children: Not on file   Years of education: Not on file   Highest education level: Not on file  Occupational History   Not on file  Tobacco Use   Smoking status: Former    Packs/day: 0.25    Years: 15.00    Additional pack years: 0.00    Total pack years: 3.75    Types: Cigarettes    Quit date: 09/08/2015    Years since quitting: 7.3   Smokeless tobacco: Never  Vaping Use   Vaping Use: Never used  Substance and Sexual Activity   Alcohol use: Yes    Comment: very rare per pt    Drug use: No   Sexual activity: Yes    Birth control/protection: Surgical    Comment: tubial ligation  Other Topics Concern   Not on file  Social History Narrative   Not on file   Social Determinants of Health   Financial Resource Strain: Not on file  Food Insecurity: Not on file  Transportation Needs: Not on file  Physical Activity: Not on file  Stress:  Not on file  Social Connections: Not on file  Intimate Partner Violence: Not on file    Current Outpatient Medications on File Prior to Visit  Medication Sig Dispense Refill   hydrOXYzine (ATARAX) 25 MG tablet Take 1 tablet by mouth 3 (three) times daily as needed.     levothyroxine (SYNTHROID) 50 MCG tablet Take 50 mcg by mouth daily before breakfast.     venlafaxine XR (EFFEXOR-XR) 37.5 MG 24 hr capsule Take 75 mg by mouth.      No current facility-administered medications on file prior to visit.    No Known Allergies   Review of Systems Constitutional: negative for chills, fatigue, fevers and sweats Eyes: negative for irritation, redness and visual disturbance Ears, nose, mouth, throat, and face: negative for hearing loss, nasal congestion, snoring and tinnitus Respiratory: negative for asthma, cough, sputum Cardiovascular: negative for chest pain, dyspnea, exertional chest pressure/discomfort, irregular heart beat, palpitations and syncope Gastrointestinal: negative for abdominal pain, change in bowel habits, nausea and  vomiting Genitourinary: negative for abnormal menstrual periods, genital lesions, sexual problems and vaginal discharge, dysuria and urinary incontinence Integument/breast: negative for breast lump, breast tenderness and nipple discharge Hematologic/lymphatic: negative for bleeding and easy bruising Musculoskeletal:negative for back pain and muscle weakness Neurological: negative for dizziness, headaches, vertigo and weakness Endocrine: negative for diabetic symptoms including polydipsia, polyuria and skin dryness Allergic/Immunologic: negative for hay fever and urticaria      Objective:  There were no vitals taken for this visit. There is no height or weight on file to calculate BMI.    General Appearance:    Alert, cooperative, no distress, appears stated age  Head:    Normocephalic, without obvious abnormality, atraumatic  Eyes:    PERRL, conjunctiva/corneas clear, EOM's intact, both eyes  Ears:    Normal external ear canals, both ears  Nose:   Nares normal, septum midline, mucosa normal, no drainage or sinus tenderness  Throat:   Lips, mucosa, and tongue normal; teeth and gums normal  Neck:   Supple, symmetrical, trachea midline, no adenopathy; thyroid: no enlargement/tenderness/nodules; no carotid bruit or JVD  Back:     Symmetric, no curvature, ROM normal, no CVA tenderness  Lungs:     Clear to auscultation bilaterally, respirations unlabored  Chest Wall:    No tenderness or deformity   Heart:    Regular rate and rhythm, S1 and S2 normal, no murmur, rub or gallop  Breast Exam:    No tenderness, masses, or nipple abnormality  Abdomen:     Soft, non-tender, bowel sounds active all four quadrants, no masses, no organomegaly.    Genitalia:    Pelvic:external genitalia normal, vagina without lesions, discharge, or tenderness, rectovaginal septum  normal. Cervix normal in appearance, no cervical motion tenderness, no adnexal masses or tenderness.  Uterus normal size, shape, mobile, regular  contours, nontender.  Rectal:    Normal external sphincter.  No hemorrhoids appreciated. Internal exam not done.   Extremities:   Extremities normal, atraumatic, no cyanosis or edema  Pulses:   2+ and symmetric all extremities  Skin:   Skin color, texture, turgor normal, no rashes or lesions  Lymph nodes:   Cervical, supraclavicular, and axillary nodes normal  Neurologic:   CNII-XII intact, normal strength, sensation and reflexes throughout   .  Labs:  Lab Results  Component Value Date   WBC 4.5 05/29/2019   HGB 12.4 05/29/2019   HCT 37.2 05/29/2019   MCV 76 (L) 05/29/2019   PLT 268 05/29/2019  Lab Results  Component Value Date   CREATININE 0.76 05/27/2019   BUN 10 05/27/2019   NA 138 05/27/2019   K 4.1 05/27/2019   CL 99 05/27/2019   CO2 23 05/27/2019    Lab Results  Component Value Date   ALT 31 05/27/2019   AST 35 05/27/2019   ALKPHOS 98 05/27/2019   BILITOT <0.2 05/27/2019    Lab Results  Component Value Date   TSH 5.260 (H) 05/27/2019     Assessment:   1. Encounter for screening mammogram for breast cancer   2. Well woman exam with routine gynecological exam      Plan:  Blood tests: Ordered today. Breast self exam technique reviewed and patient encouraged to perform self-exam monthly. Contraception: tubal ligation. Discussed healthy lifestyle modifications. Mammogram ordered Pap smear  UTD . COVID vaccination status: Follow up in 1 year for annual exam   Rubie Maid, MD [place]

## 2023-01-02 ENCOUNTER — Ambulatory Visit: Payer: Medicaid Other | Admitting: Obstetrics and Gynecology

## 2023-01-02 DIAGNOSIS — Z1231 Encounter for screening mammogram for malignant neoplasm of breast: Secondary | ICD-10-CM

## 2023-01-02 DIAGNOSIS — Z1159 Encounter for screening for other viral diseases: Secondary | ICD-10-CM

## 2023-01-02 DIAGNOSIS — Z13220 Encounter for screening for lipoid disorders: Secondary | ICD-10-CM

## 2023-01-02 DIAGNOSIS — Z131 Encounter for screening for diabetes mellitus: Secondary | ICD-10-CM

## 2023-01-02 DIAGNOSIS — Z01419 Encounter for gynecological examination (general) (routine) without abnormal findings: Secondary | ICD-10-CM

## 2023-02-04 ENCOUNTER — Ambulatory Visit: Payer: Medicaid Other | Admitting: Obstetrics & Gynecology

## 2023-03-24 DIAGNOSIS — N870 Mild cervical dysplasia: Secondary | ICD-10-CM | POA: Insufficient documentation

## 2023-03-24 NOTE — Progress Notes (Signed)
PCP:  Jerl Mina, MD   Chief Complaint  Patient presents with   Gynecologic Exam     HPI:      [name] is a 42 y.o. [id number] whose LMP was Patient's last menstrual period was 03/04/2023 (approximate)., presents today for her annual examination.  Her menses are regular every 28-30 days, lasting 6-7 days, heavy flow for 2 days with dime to quarter sized clots.  Dysmenorrhea mild, no BTB.  Sex activity: single partner, contraception - tubal ligation. No pain/bleeding/dryness. Last Pap: 11/29/21 Results were ASCUS/positive HPV DNA. Colpo 7/23 with Dr. Valentino Saxon with CIN 1, repeat pap due in 12 months 11/07/20 Results were no abnormalities/POS HPV DNA. Prior paps neg.  Hx of STDs: HPV on pap, HSV 2 IgG pos  Last mammogram: never There is a FH of breast cancer in her PGM, age unknown. There is no FH of ovarian cancer. The patient does not do self-breast exams.  Tobacco use: The patient denies current or previous tobacco use. Alcohol use: none No drug use.  Exercise: min active  She does get adequate calcium but not Vitamin D in her diet.  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    Past Surgical History:  Procedure Laterality Date   DILATION AND CURETTAGE OF UTERUS  2006   MOUTH SURGERY     TUBAL LIGATION N/A 05/03/2016   Procedure: POST PARTUM TUBAL LIGATION;  Surgeon: Hildred Laser, MD;  Location: [facility] ORS;  Service: Gynecology;  Laterality: N/A;    Family History  Problem Relation Age of Onset   Diabetes Mother    Heart attack Father    Diabetes Father    Breast cancer Paternal Grandmother        unknown age    Social History   Socioeconomic History   Marital status: Single    Spouse name: Not on file   Number of children: Not on file   Years of education: Not on file   Highest  education level: Not on file  Occupational History   Not on file  Tobacco Use   Smoking status: Former    Packs/day: 0.25    Years: 15.00    Additional pack years: 0.00    Total pack years: 3.75    Types: Cigarettes    Quit date: 09/08/2015    Years since quitting: 7.5   Smokeless tobacco: Never  Vaping Use   Vaping Use: Never used  Substance and Sexual Activity   Alcohol use: Yes    Comment: very rare per pt    Drug use: No   Sexual activity: Yes    Birth control/protection: Surgical    Comment: tubial ligation  Other Topics Concern   Not on file  Social History Narrative   Not on file   Social Determinants of Health   Financial Resource Strain: Not on file  Food Insecurity: Not on file  Transportation Needs: Not on file  Physical Activity: Not on file  Stress: Not on file  Social Connections: Not on file  Intimate Partner Violence: Not on file     Current Outpatient Medications:    hydrOXYzine (ATARAX) 25 MG tablet, Take 1 tablet by mouth 3 (three) times daily as needed., Disp: , Rfl:    pantoprazole (PROTONIX) 40 MG tablet, Take by mouth., Disp: , Rfl:  thyroid (ARMOUR) 30 MG tablet, Take by mouth., Disp: , Rfl:    venlafaxine XR (EFFEXOR-XR) 37.5 MG 24 hr capsule, Take 75 mg by mouth. , Disp: , Rfl:      ROS:  Review of Systems  Constitutional:  Negative for fatigue, fever and unexpected weight change.  Respiratory:  Negative for cough, shortness of breath and wheezing.   Cardiovascular:  Negative for chest pain, palpitations and leg swelling.  Gastrointestinal:  Negative for blood in stool, constipation, diarrhea, nausea and vomiting.  Endocrine: Negative for cold intolerance, heat intolerance and polyuria.  Genitourinary:  Negative for dyspareunia, dysuria, flank pain, frequency, genital sores, hematuria, menstrual problem, pelvic pain, urgency, vaginal bleeding, vaginal discharge and vaginal pain.  Musculoskeletal:  Negative for back pain, joint  swelling and myalgias.  Skin:  Negative for rash.  Neurological:  Positive for headaches. Negative for dizziness, syncope, light-headedness and numbness.  Hematological:  Negative for adenopathy.  Psychiatric/Behavioral:  Positive for agitation. Negative for confusion, sleep disturbance and suicidal ideas. The patient is not nervous/anxious.    BREAST: No symptoms   Objective: BP 114/80   Ht 5\' 4"  (1.626 m)   Wt 223 lb (101.2 kg)   LMP 03/04/2023 (Approximate)   BMI 38.28 kg/m    Physical Exam Constitutional:      Appearance: She is well-developed.  Genitourinary:     Vulva normal.     Right Labia: No rash, tenderness or lesions.    Left Labia: No tenderness, lesions or rash.    No vaginal discharge, erythema or tenderness.      Right Adnexa: not tender and no mass present.    Left Adnexa: not tender and no mass present.    No cervical friability or polyp.     Uterus is not enlarged or tender.  Breasts:    Right: No mass, nipple discharge, skin change or tenderness.     Left: No mass, nipple discharge, skin change or tenderness.  Neck:     Thyroid: No thyromegaly.  Cardiovascular:     Rate and Rhythm: Normal rate and regular rhythm.     Heart sounds: Normal heart sounds. No murmur heard. Pulmonary:     Effort: Pulmonary effort is normal.     Breath sounds: Normal breath sounds.  Abdominal:     Palpations: Abdomen is soft.     Tenderness: There is no abdominal tenderness. There is no guarding or rebound.  Musculoskeletal:        General: Normal range of motion.     Cervical back: Normal range of motion.  Lymphadenopathy:     Cervical: No cervical adenopathy.  Neurological:     General: No focal deficit present.     Mental Status: She is alert and oriented to person, place, and time.     Cranial Nerves: No cranial nerve deficit.  Skin:    General: Skin is warm and dry.  Psychiatric:        Mood and Affect: Mood normal.        Behavior: Behavior normal.         Thought Content: Thought content normal.        Judgment: Judgment normal.  Vitals reviewed.     Assessment/Plan: Encounter for annual routine gynecological examination  Cervical cancer screening - Plan: Cytology - PAP  Screening for HPV (human papillomavirus) - Plan: Cytology - PAP  Dysplasia of cervix, low grade (CIN 1) - Plan: Cytology - PAP; repeat pap today. Will follow up with results  Encounter for screening mammogram for malignant neoplasm of breast - Plan: MM 3D SCREENING MAMMOGRAM BILATERAL BREAST; pt to schedule mammo       GYN counsel breast self exam, mammography screening, adequate intake of calcium and vitamin D, diet and exercise     F/U  Return in about 1 year (around 03/24/2024).  Kyia Rhude B. Doha Boling, PA-C 03/25/2023 8:46 AM

## 2023-03-25 ENCOUNTER — Encounter: Payer: Self-pay | Admitting: Obstetrics and Gynecology

## 2023-03-25 ENCOUNTER — Other Ambulatory Visit (HOSPITAL_COMMUNITY)
Admission: RE | Admit: 2023-03-25 | Discharge: 2023-03-25 | Disposition: A | Discharge status: Home / Self Care | Payer: Medicaid Other | Source: Ambulatory Visit | Attending: Obstetrics | Admitting: Obstetrics

## 2023-03-25 ENCOUNTER — Ambulatory Visit (INDEPENDENT_AMBULATORY_CARE_PROVIDER_SITE_OTHER): Payer: Medicaid Other | Admitting: Obstetrics and Gynecology

## 2023-03-25 VITALS — BP 114/80 | Ht 64.0 in | Wt 223.0 lb

## 2023-03-25 DIAGNOSIS — N870 Mild cervical dysplasia: Secondary | ICD-10-CM | POA: Insufficient documentation

## 2023-03-25 DIAGNOSIS — Z124 Encounter for screening for malignant neoplasm of cervix: Secondary | ICD-10-CM | POA: Insufficient documentation

## 2023-03-25 DIAGNOSIS — Z01419 Encounter for gynecological examination (general) (routine) without abnormal findings: Secondary | ICD-10-CM

## 2023-03-25 DIAGNOSIS — Z1151 Encounter for screening for human papillomavirus (HPV): Secondary | ICD-10-CM

## 2023-03-25 DIAGNOSIS — Z1231 Encounter for screening mammogram for malignant neoplasm of breast: Secondary | ICD-10-CM

## 2023-03-25 NOTE — Patient Instructions (Addendum)
I value your feedback and you entrusting us with your care. If you get a [facility] patient survey, I would appreciate you taking the time to let us know about your experience today. Thank you!  [facility]: [phone]  [facility]: [phone]    

## 2023-03-27 LAB — CYTOLOGY - PAP
Comment: NEGATIVE
Diagnosis: NEGATIVE
Diagnosis: REACTIVE
High risk HPV: NEGATIVE

## 2023-04-08 ENCOUNTER — Emergency Department: Payer: Medicaid Other

## 2023-04-08 ENCOUNTER — Other Ambulatory Visit: Payer: Self-pay

## 2023-04-08 ENCOUNTER — Emergency Department
Admission: EM | Admit: 2023-04-08 | Discharge: 2023-04-08 | Disposition: A | Discharge status: Home / Self Care | Payer: Medicaid Other | Attending: Emergency Medicine | Admitting: Emergency Medicine

## 2023-04-08 DIAGNOSIS — K029 Dental caries, unspecified: Secondary | ICD-10-CM | POA: Insufficient documentation

## 2023-04-08 DIAGNOSIS — K0889 Other specified disorders of teeth and supporting structures: Secondary | ICD-10-CM | POA: Diagnosis present

## 2023-04-08 DIAGNOSIS — R112 Nausea with vomiting, unspecified: Secondary | ICD-10-CM | POA: Diagnosis not present

## 2023-04-08 LAB — CBC WITH DIFFERENTIAL/PLATELET
Abs Immature Granulocytes: 0.02 10*3/uL (ref 0.00–0.07)
Basophils Absolute: 0 10*3/uL (ref 0.0–0.1)
Basophils Relative: 1 %
Eosinophils Absolute: 0.1 10*3/uL (ref 0.0–0.5)
Eosinophils Relative: 1 %
HCT: 41.9 % (ref 36.0–46.0)
Hemoglobin: 13.3 g/dL (ref 12.0–15.0)
Immature Granulocytes: 0 %
Lymphocytes Relative: 23 %
Lymphs Abs: 1.7 10*3/uL (ref 0.7–4.0)
MCH: 26.1 pg (ref 26.0–34.0)
MCHC: 31.7 g/dL (ref 30.0–36.0)
MCV: 82.2 fL (ref 80.0–100.0)
Monocytes Absolute: 0.5 10*3/uL (ref 0.1–1.0)
Monocytes Relative: 6 %
Neutro Abs: 5.1 10*3/uL (ref 1.7–7.7)
Neutrophils Relative %: 69 %
Platelets: 286 10*3/uL (ref 150–400)
RBC: 5.1 MIL/uL (ref 3.87–5.11)
RDW: 14.3 % (ref 11.5–15.5)
WBC: 7.4 10*3/uL (ref 4.0–10.5)
nRBC: 0 % (ref 0.0–0.2)

## 2023-04-08 LAB — BASIC METABOLIC PANEL
Anion gap: 10 (ref 5–15)
BUN: 9 mg/dL (ref 6–20)
CO2: 24 mmol/L (ref 22–32)
Calcium: 9 mg/dL (ref 8.9–10.3)
Chloride: 103 mmol/L (ref 98–111)
Creatinine, Ser: 0.73 mg/dL (ref 0.44–1.00)
GFR, Estimated: >60 mL/min (ref >60)
Glucose, Bld: 128 mg/dL — ABNORMAL HIGH (ref 70–99)
Potassium: 3.8 mmol/L (ref 3.5–5.1)
Sodium: 137 mmol/L (ref 135–145)

## 2023-04-08 MED ORDER — KETOROLAC TROMETHAMINE 30 MG/ML IJ SOLN
15.0000 mg | Freq: Once | INTRAMUSCULAR | Status: AC
  Administered 2023-04-08: 15 mg via INTRAVENOUS
  Filled 2023-04-08: qty 1

## 2023-04-08 MED ORDER — IOHEXOL 300 MG/ML  SOLN
75.0000 mL | Freq: Once | INTRAMUSCULAR | Status: AC | PRN
  Administered 2023-04-08: 75 mL via INTRAVENOUS

## 2023-04-08 MED ORDER — ONDANSETRON HCL 4 MG/2ML IJ SOLN
4.0000 mg | Freq: Once | INTRAMUSCULAR | Status: AC
  Administered 2023-04-08: 4 mg via INTRAVENOUS
  Filled 2023-04-08: qty 2

## 2023-04-08 MED ORDER — LACTATED RINGERS IV BOLUS
1000.0000 mL | Freq: Once | INTRAVENOUS | Status: AC
  Administered 2023-04-08: 1000 mL via INTRAVENOUS

## 2023-04-08 NOTE — ED Provider Notes (Signed)
[facility] Provider Note    Event Date/Time   First MD Initiated Contact with Patient 04/08/23 [phone]     (approximate)   History   Dental Pain   HPI  Mary Cooper is a 42 y.o. female who presents to the ED for evaluation of Dental Pain   Patient presents to the ED for evaluation of right maxillary pain, swelling, nausea and emesis.  Reports that she needs to see a dentist and actually has a scheduled appointment later this morning but is uncertain if she can even make it.  She went to the walk-in clinic yesterday and got a prescription for Augmentin and has taken 2 doses of it.  No fever or systemic symptoms but reports poor intake and nausea.  Reports severe pain.  No vision changes  She reports having a dentist appointment later this morning around 8 AM.   Physical Exam   Triage Vital Signs: ED Triage Vitals [04/08/23 0433]  Enc Vitals Group     BP (!) 134/90     Pulse Rate 82     Resp 18     Temp 98.2 F (36.8 C)     Temp Source Oral     SpO2 97 %     Weight 222 lb (100.7 kg)     Height 5\' 4"  (1.626 m)     Head Circumference      Peak Flow      Pain Score 6     Pain Loc      Pain Edu?      Excl. in GC?     Most recent vital signs: Vitals:   04/08/23 0433  BP: (!) 134/90  Pulse: 82  Resp: 18  Temp: 98.2 F (36.8 C)  SpO2: 97%    General: Awake, no distress.  Seems uncomfortable.  Ambulatory. CV:  Good peripheral perfusion.  Resp:  Normal effort.  Abd:  No distention.  MSK:  No deformity noted.  Neuro:  No focal deficits appreciated. Other:  Subtle right maxillary soft tissue swelling without overlying skin changes or signs of EOM.   ED Results / Procedures / Treatments   Labs (all labs ordered are listed, but only abnormal results are displayed) Labs Reviewed  BASIC METABOLIC PANEL - Abnormal; Notable for the following components:      Result Value   Glucose, Bld 128 (*)    All other components within normal limits   CBC WITH DIFFERENTIAL/PLATELET    EKG   RADIOLOGY CT maxillofacial interpreted by me without evidence of abscess  Official radiology report(s): CT Maxillofacial W Contrast  Result Date: 04/08/2023 CLINICAL DATA:  42 year old female with right upper abscessed tooth. Started on antibiotics but increasing right facial swelling. EXAM: CT MAXILLOFACIAL WITH CONTRAST TECHNIQUE: Multidetector CT imaging of the maxillofacial structures was performed with intravenous contrast. Multiplanar CT image reconstructions were also generated. RADIATION DOSE REDUCTION: This exam was performed according to the departmental dose-optimization program which includes automated exposure control, adjustment of the mA and/or kV according to patient size and/or use of iterative reconstruction technique. CONTRAST:  75mL OMNIPAQUE IOHEXOL 300 MG/ML  SOLN COMPARISON:  Head CT 01/29/2019. FINDINGS: Osseous: Carious right maxillary anterior bicuspid on series 8, image 28. Regional soft tissue inflammation, mostly affecting the buccal space and overlying subcutaneous fat. Subtle periapical lucency and lateral cortical dehiscence there best seen on series 4, image 41. No subperiosteal abscess. No other acute maxillary dental finding. Mandible intact and normally located. Chronic right  TMJ degeneration. No acute mandible dental finding. Intact bilateral zygoma, pterygoid bones, maxilla, nasal bones. Visible central skull base appears intact. Visible cervical vertebrae appear intact and aligned. Orbits: No orbital wall fracture. Orbital soft tissues appears symmetric and normal. Sinuses: Abundant mucosal thickening in the right maxillary alveolar recess with some periosteal thickening there. And there is subtle medial root periapical lucency of the anterior bicuspid seen on series 8, image 28 and this might communicate with the maxillary sinus on image 29. The other paranasal sinuses are well aerated, there is a small contralateral left  maxillary recess retention cysts. Tympanic cavities and mastoids are clear. Soft tissues: Negative visible thyroid, larynx, pharynx, parapharyngeal spaces, retropharyngeal space, sublingual space, submandibular spaces, parotid spaces and left masticator space. Most of the right masticator space remains normal. There is right maxilla and mandible region face subcutaneous stranding and swelling with thickening of the platysma. No soft tissue gas. No organized or drainable fluid collection. The major vascular structures in the neck and at the skull base are enhancing and patent. There are small reactive appearing right level 1 and level 2 lymph nodes. Limited intracranial: Mild calcified atherosclerosis at the skull base. Negative visible brain parenchyma. IMPRESSION: 1. Carious right maxillary anterior bicuspid with subtle cortical dehiscence and Odontogenic regional facial Cellulitis. And evidence of associated Odontogenic Right Maxillary Sinusitis also. But no abscess or other complicating features. 2. Chronic right TMJ degeneration. Electronically Signed   By: Odessa Fleming M.D.   On: 04/08/2023 06:18    PROCEDURES and INTERVENTIONS:  Procedures  Medications  lactated ringers bolus 1,000 mL (1,000 mLs Intravenous New Bag/Given 04/08/23 0518)  ketorolac (TORADOL) 30 MG/ML injection 15 mg (15 mg Intravenous Given 04/08/23 0519)  ondansetron (ZOFRAN) injection 4 mg (4 mg Intravenous Given 04/08/23 0519)  iohexol (OMNIPAQUE) 300 MG/ML solution 75 mL (75 mLs Intravenous Contrast Given 04/08/23 0558)     IMPRESSION / MDM / ASSESSMENT AND PLAN / ED COURSE  I reviewed the triage vital signs and the nursing notes.  Differential diagnosis includes, but is not limited to, periapical abscess, cellulitis, EOM entrapment, trauma  {Patient presents with symptoms of an acute illness or injury that is potentially life-threatening.  Patient presents with dental and maxillary pain and swelling, likely localized cellulitis or  dental infection suitable for continued outpatient management.  Looks well with reassuring vitals.  No signs of upper airway obstruction or EOM entrapment.  Localized mild swelling but no appreciable abscess on exam but I could I&D.  Due to her reports of systemic symptoms, we obtain blood work and a CT.  Normal CBC and metabolic panel without leukocytosis no SIRS criteria or sepsis.  No abscess on CT.  Improving symptoms with Toradol.  Suitable for outpatient management.  Clinical Course as of 04/08/23 0709  Mon Apr 08, 2023  2956 Reassessed.  Feeling better.  We discussed CT results and following up with her dentist later this morning [DS]    Clinical Course User Index [DS] Delton Prairie, MD     FINAL CLINICAL IMPRESSION(S) / ED DIAGNOSES   Final diagnoses:  Pain, dental  Dental caries     Rx / DC Orders   ED Discharge Orders     None        Note:  This document was prepared using Dragon voice recognition software and may include unintentional dictation errors.   Delton Prairie, MD 04/08/23 [phone]

## 2023-04-08 NOTE — ED Triage Notes (Signed)
Pt states she has an abscessed tooth on the upper right side, pt was seen yesterday at [facility] and given abx, pt woke up this morning with increased swelling to right side of face. Pt denies difficulty breathing or swallowing.

## 2023-04-08 NOTE — Discharge Instructions (Signed)
Take your antibiotics and follow-up with the dentist today  Please take Tylenol and ibuprofen/Advil for your pain.  It is safe to take them together, or to alternate them every few hours.  Take up to 1000mg  of Tylenol at a time, up to 4 times per day.  Do not take more than 4000 mg of Tylenol in 24 hours.  For ibuprofen, take 400-600 mg, 3 - 4 times per day.

## 2023-04-18 ENCOUNTER — Emergency Department
Admission: EM | Admit: 2023-04-18 | Discharge: 2023-04-18 | Disposition: A | Discharge status: Home / Self Care | Payer: Medicaid Other | Attending: Emergency Medicine | Admitting: Emergency Medicine

## 2023-04-18 ENCOUNTER — Encounter: Payer: Self-pay | Admitting: Emergency Medicine

## 2023-04-18 ENCOUNTER — Other Ambulatory Visit: Payer: Self-pay

## 2023-04-18 ENCOUNTER — Emergency Department: Payer: Medicaid Other

## 2023-04-18 DIAGNOSIS — R079 Chest pain, unspecified: Secondary | ICD-10-CM | POA: Diagnosis present

## 2023-04-18 LAB — CBC
HCT: 40.3 % (ref 36.0–46.0)
Hemoglobin: 13.4 g/dL (ref 12.0–15.0)
MCH: 26.3 pg (ref 26.0–34.0)
MCHC: 33.3 g/dL (ref 30.0–36.0)
MCV: 79 fL — ABNORMAL LOW (ref 80.0–100.0)
Platelets: 316 10*3/uL (ref 150–400)
RBC: 5.1 MIL/uL (ref 3.87–5.11)
RDW: 13.7 % (ref 11.5–15.5)
WBC: 6.4 10*3/uL (ref 4.0–10.5)
nRBC: 0 % (ref 0.0–0.2)

## 2023-04-18 LAB — BASIC METABOLIC PANEL
Anion gap: 11 (ref 5–15)
BUN: 8 mg/dL (ref 6–20)
CO2: 23 mmol/L (ref 22–32)
Calcium: 9.5 mg/dL (ref 8.9–10.3)
Chloride: 105 mmol/L (ref 98–111)
Creatinine, Ser: 0.65 mg/dL (ref 0.44–1.00)
GFR, Estimated: >60 mL/min (ref >60)
Glucose, Bld: 109 mg/dL — ABNORMAL HIGH (ref 70–99)
Potassium: 3.4 mmol/L — ABNORMAL LOW (ref 3.5–5.1)
Sodium: 139 mmol/L (ref 135–145)

## 2023-04-18 LAB — TROPONIN I (HIGH SENSITIVITY): Troponin I (High Sensitivity): <2 ng/L (ref <18)

## 2023-04-18 LAB — LIPASE, BLOOD: Lipase: 32 U/L (ref 11–51)

## 2023-04-18 NOTE — ED Provider Notes (Signed)
[facility] Emergency Department Provider Note     Event Date/Time   First MD Initiated Contact with Patient 04/18/23 Paulo Fruit     (approximate)   History   Chest Pain   HPI  Mary Cooper is a 42 y.o. female with a history of GERD, anxiety, and panic disorder, presents to the ED for intermittent right-sided chest pain.  Patient would endorse that the pain radiates into her back.  She notes onset of symptoms a few days prior, but notes symptoms worsened today.  She denies any shortness of breath, cough, or congestion.  No nausea, vomiting, or diaphoresis.  Patient was apparently tearful in triage.     Physical Exam   Triage Vital Signs: ED Triage Vitals  Encounter Vitals Group     BP 04/18/23 1800 (!) 143/99     Systolic BP Percentile --      Diastolic BP Percentile --      Pulse Rate 04/18/23 1800 81     Resp 04/18/23 1800 18     Temp 04/18/23 1800 98.2 F (36.8 C)     Temp Source 04/18/23 1800 Oral     SpO2 04/18/23 1800 100 %     Weight --      Height --      Head Circumference --      Peak Flow --      Pain Score 04/18/23 1758 5     Pain Loc --      Pain Education --      Exclude from Growth Chart --     Most recent vital signs: Vitals:   04/18/23 1800 04/18/23 2010  BP: (!) 143/99 (!) 161/87  Pulse: 81 65  Resp: 18 18  Temp: 98.2 F (36.8 C)   SpO2: 100% 95%    General Awake, no distress.  NAD HEENT NCAT. PERRL. EOMI. No rhinorrhea. Mucous membranes are moist.  CV:  Good peripheral perfusion. RRR RESP:  Normal effort. CTA ABD:  No distention.     ED Results / Procedures / Treatments   Labs (all labs ordered are listed, but only abnormal results are displayed) Labs Reviewed  BASIC METABOLIC PANEL - Abnormal; Notable for the following components:      Result Value   Potassium 3.4 (*)    Glucose, Bld 109 (*)    All other components within normal limits  CBC - Abnormal; Notable for the following components:   MCV 79.0  (*)    All other components within normal limits  LIPASE, BLOOD  TROPONIN I (HIGH SENSITIVITY)     EKG  Vent. rate 78 BPM PR interval 180 ms QRS duration 90 ms QT/QTcB 388/442 ms P-R-T axes 58 26 54 Normal sinus rhythm Normal ECG When compared with ECG of 11-Jun-2018 19:46,  RADIOLOGY  I personally viewed and evaluated these images as part of my medical decision making, as well as reviewing the written report by the radiologist.  ED Provider Interpretation: No acute findings  DG Chest 2 View  Result Date: 04/18/2023 CLINICAL DATA:  Right-sided chest pain. EXAM: CHEST - 2 VIEW COMPARISON:  None Available. FINDINGS: The cardiomediastinal silhouette is normal There is no focal consolidation or pulmonary edema. There is no pleural effusion or pneumothorax There is no acute osseous abnormality. IMPRESSION: No radiographic evidence of acute cardiopulmonary process. Electronically Signed   By: Lesia Hausen M.D.   On: 04/18/2023 18:21     PROCEDURES:  Critical Care performed: No  Procedures   MEDICATIONS ORDERED IN ED: Medications - No data to display   IMPRESSION / MDM / ASSESSMENT AND PLAN / ED COURSE  I reviewed the triage vital signs and the nursing notes.                              Differential diagnosis includes, but is not limited to, ACS, aortic dissection, pulmonary embolism, cardiac tamponade, pneumothorax, pneumonia, pericarditis, myocarditis, GI-related causes including esophagitis/gastritis, and musculoskeletal chest wall pain.     Patient's presentation is most consistent with acute complicated illness / injury requiring diagnostic workup.  Patient's diagnosis is consistent with nonspecific chest pain likely with a component of stress and anxiety.  With reassuring exam workup at this time including negative troponin no complaints of active chest pain.  Chest x-ray without evidence of intrathoracic process, EKG without signs of malignant arrhythmia, and  remaining labs are unremarkable.  Patient is to follow up with her primary provider as needed or otherwise directed. Patient is given ED precautions to return to the ED for any worsening or new symptoms.     FINAL CLINICAL IMPRESSION(S) / ED DIAGNOSES   Final diagnoses:  Nonspecific chest pain     Rx / DC Orders   ED Discharge Orders     None        Note:  This document was prepared using Dragon voice recognition software and may include unintentional dictation errors.    Lissa Hoard, PA-C 04/19/23 1542    Jene Every, MD 04/22/23 1414

## 2023-04-18 NOTE — Discharge Instructions (Addendum)
Your exam and labs, EKG and chest x-ray are normal and reassuring at this time.  No signs of a serious underlying cardiac source for your symptoms.  You should follow-up with your primary provider as scheduled.  Return to the ED if needed.

## 2023-04-18 NOTE — ED Notes (Signed)
Pt unable to provide urine sample after triage. Pt provided a labeled specimen cup and informed to return it once she has urinated in the provided cup.

## 2023-04-18 NOTE — ED Triage Notes (Signed)
Patient to ED via POV for intermittent, right-sided chest pain. Pain radiates into back- started a few days ago but worse today. Patient crying in triage. Hx of anxiety and panic attacks. Patient states she also has routine labs with PCP today that showed an elevated glucose.

## 2023-04-23 ENCOUNTER — Ambulatory Visit
Admission: RE | Admit: 2023-04-23 | Discharge: 2023-04-23 | Disposition: A | Discharge status: Home / Self Care | Payer: Medicaid Other | Source: Ambulatory Visit | Attending: Obstetrics and Gynecology | Admitting: Obstetrics and Gynecology

## 2023-04-23 DIAGNOSIS — Z1231 Encounter for screening mammogram for malignant neoplasm of breast: Secondary | ICD-10-CM | POA: Insufficient documentation

## 2023-04-24 ENCOUNTER — Other Ambulatory Visit: Payer: Self-pay | Admitting: Obstetrics and Gynecology

## 2023-04-24 DIAGNOSIS — R928 Other abnormal and inconclusive findings on diagnostic imaging of breast: Secondary | ICD-10-CM

## 2023-04-24 DIAGNOSIS — N6489 Other specified disorders of breast: Secondary | ICD-10-CM

## 2023-04-25 ENCOUNTER — Other Ambulatory Visit (HOSPITAL_COMMUNITY)
Admission: RE | Admit: 2023-04-25 | Discharge: 2023-04-25 | Disposition: A | Discharge status: Home / Self Care | Payer: Medicaid Other | Source: Ambulatory Visit | Attending: Obstetrics and Gynecology | Admitting: Obstetrics and Gynecology

## 2023-04-25 ENCOUNTER — Ambulatory Visit (INDEPENDENT_AMBULATORY_CARE_PROVIDER_SITE_OTHER): Payer: Medicaid Other

## 2023-04-25 VITALS — BP 134/71 | HR 74 | Resp 16 | Ht 64.0 in | Wt 213.8 lb

## 2023-04-25 DIAGNOSIS — N760 Acute vaginitis: Secondary | ICD-10-CM | POA: Insufficient documentation

## 2023-04-25 NOTE — Progress Notes (Signed)
    NURSE VISIT NOTE  Subjective:    Patient ID: Mary Cooper, female    DOB: [birth year], 42 y.o.   MRN: [id number]  HPI  Patient is a 42 y.o. [id number] female who presents for milky vaginal discharge for 1 week(s). Denies abnormal vaginal bleeding or significant pelvic pain or fever. denies    . Patient denies history of known exposure to STD.   Objective:    BP (!) 132/92   Pulse 80   Resp 16   Ht 5\' 4"  (1.626 m)   Wt 213 lb 12.8 oz (97 kg)   LMP 04/09/2023 (Approximate)   BMI 36.70 kg/m    @THIS  VISIT ONLY@  Assessment:   1. Acute vaginitis       Plan:   GC and chlamydia DNA  probe sent to lab. Treatment: self swab sent out, will treat when results are back. ROV prn if symptoms persist or worsen.

## 2023-04-26 LAB — CERVICOVAGINAL ANCILLARY ONLY
Bacterial Vaginitis (gardnerella): NEGATIVE
Candida Glabrata: NEGATIVE
Candida Vaginitis: POSITIVE — AB
Comment: NEGATIVE
Comment: NEGATIVE
Comment: NEGATIVE

## 2023-04-28 MED ORDER — FLUCONAZOLE 150 MG PO TABS: 150.0000 mg | ORAL_TABLET | Freq: Once | ORAL | 0 refills | Status: AC

## 2023-04-28 NOTE — Addendum Note (Signed)
Addended by: Althea Grimmer B on: 04/28/2023 06:27 PM   Modules accepted: Orders

## 2023-04-29 ENCOUNTER — Other Ambulatory Visit: Payer: Self-pay

## 2023-04-29 ENCOUNTER — Telehealth: Payer: Self-pay

## 2023-04-29 DIAGNOSIS — B379 Candidiasis, unspecified: Secondary | ICD-10-CM

## 2023-04-29 MED ORDER — FLUCONAZOLE 150 MG PO TABS
150.0000 mg | ORAL_TABLET | Freq: Every day | ORAL | 0 refills | Status: AC
Start: 2023-04-29 — End: ?

## 2023-04-29 NOTE — Telephone Encounter (Signed)
Patient called she has been treated for yeast infection with Diflucan. She has a rash in the creases of her thighs, where her thighs rub together. She is requesting a cream to treat the yeast between her legs. Please advise.

## 2023-04-29 NOTE — Telephone Encounter (Signed)
Sounds like jock itch. Can use over-the-counter clotrimazole cream twice daily as needed for 2-4 wks. Keep area dry with antifungal powder too (just don't put it vaginally).

## 2023-04-29 NOTE — Progress Notes (Signed)
Diflucan sent to pharmacy, pt made aware via MyChart

## 2023-04-29 NOTE — Telephone Encounter (Signed)
Pt aware.

## 2023-05-02 ENCOUNTER — Ambulatory Visit
Admission: RE | Admit: 2023-05-02 | Discharge: 2023-05-02 | Disposition: A | Discharge status: Home / Self Care | Payer: Medicaid Other | Source: Ambulatory Visit | Attending: Obstetrics and Gynecology | Admitting: Obstetrics and Gynecology

## 2023-05-02 DIAGNOSIS — N6489 Other specified disorders of breast: Secondary | ICD-10-CM | POA: Diagnosis present

## 2023-05-02 DIAGNOSIS — R928 Other abnormal and inconclusive findings on diagnostic imaging of breast: Secondary | ICD-10-CM | POA: Insufficient documentation

## 2023-05-21 ENCOUNTER — Ambulatory Visit: Payer: Medicaid Other | Admitting: Dietician

## 2023-12-04 ENCOUNTER — Emergency Department
Admission: EM | Admit: 2023-12-04 | Discharge: 2023-12-04 | Disposition: A | Discharge status: Home / Self Care | Payer: Medicaid Other | Attending: Emergency Medicine | Admitting: Emergency Medicine

## 2023-12-04 ENCOUNTER — Other Ambulatory Visit: Payer: Self-pay

## 2023-12-04 DIAGNOSIS — R202 Paresthesia of skin: Secondary | ICD-10-CM | POA: Insufficient documentation

## 2023-12-04 DIAGNOSIS — R110 Nausea: Secondary | ICD-10-CM | POA: Insufficient documentation

## 2023-12-04 LAB — CBC WITH DIFFERENTIAL/PLATELET
Abs Immature Granulocytes: 0.01 10*3/uL (ref 0.00–0.07)
Basophils Absolute: 0.1 10*3/uL (ref 0.0–0.1)
Basophils Relative: 1 %
Eosinophils Absolute: 0.1 10*3/uL (ref 0.0–0.5)
Eosinophils Relative: 2 %
HCT: 40.1 % (ref 36.0–46.0)
Hemoglobin: 12.6 g/dL (ref 12.0–15.0)
Immature Granulocytes: 0 %
Lymphocytes Relative: 33 %
Lymphs Abs: 1.6 10*3/uL (ref 0.7–4.0)
MCH: 25.8 pg — ABNORMAL LOW (ref 26.0–34.0)
MCHC: 31.4 g/dL (ref 30.0–36.0)
MCV: 82.2 fL (ref 80.0–100.0)
Monocytes Absolute: 0.4 10*3/uL (ref 0.1–1.0)
Monocytes Relative: 8 %
Neutro Abs: 2.7 10*3/uL (ref 1.7–7.7)
Neutrophils Relative %: 56 %
Platelets: 276 10*3/uL (ref 150–400)
RBC: 4.88 MIL/uL (ref 3.87–5.11)
RDW: 14.5 % (ref 11.5–15.5)
WBC: 4.8 10*3/uL (ref 4.0–10.5)
nRBC: 0 % (ref 0.0–0.2)

## 2023-12-04 LAB — BASIC METABOLIC PANEL
Anion gap: 7 (ref 5–15)
BUN: 10 mg/dL (ref 6–20)
CO2: 28 mmol/L (ref 22–32)
Calcium: 9.2 mg/dL (ref 8.9–10.3)
Chloride: 104 mmol/L (ref 98–111)
Creatinine, Ser: 0.66 mg/dL (ref 0.44–1.00)
GFR, Estimated: >60 mL/min (ref >60)
Glucose, Bld: 103 mg/dL — ABNORMAL HIGH (ref 70–99)
Potassium: 3.8 mmol/L (ref 3.5–5.1)
Sodium: 139 mmol/L (ref 135–145)

## 2023-12-04 LAB — T4, FREE: Free T4: 0.67 ng/dL (ref 0.61–1.12)

## 2023-12-04 LAB — TSH: TSH: 4.321 u[IU]/mL (ref 0.350–4.500)

## 2023-12-04 LAB — MAGNESIUM: Magnesium: 2.2 mg/dL (ref 1.7–2.4)

## 2023-12-04 NOTE — Discharge Instructions (Signed)
 Please follow-up with your primary care provider in March for continued outpatient evaluation.  Your laboratory workup otherwise is all reassuring here at this time.

## 2023-12-04 NOTE — ED Triage Notes (Signed)
 Pt sts that she has been having these feelings of feeling tingling with nausea. Pt sts that she does have anxiety and panic attacks. Pt is tearful while in triage at this time.

## 2023-12-04 NOTE — ED Notes (Signed)
 Patient discharged from ED by provider. Discharge instructions reviewed with patient and all questions answered. Patient ambulatory from ED in NAD.

## 2023-12-04 NOTE — ED Provider Notes (Signed)
 [facility] Provider Note    Event Date/Time   First MD Initiated Contact with Patient 12/04/23 1032     (approximate)   History   Tingling   HPI Mary Cooper is a 43 y.o. female with history of anxiety presenting today for tingling.  Patient states over the past several months she has had intermittent episodes where she gets nauseous and then has tingling throughout her body.  She denies any numbness.  These episodes last approximately 30 to 60 seconds.  She has had 2 in the past 24 hours and was unable to get into her see her PCP.  Otherwise she denies lightheadedness, dizziness, chest pain, shortness of breath, weakness, vomiting, abdominal pain.  Currently asymptomatic.     Physical Exam   Triage Vital Signs: ED Triage Vitals [12/04/23 1008]  Encounter Vitals Group     BP (!) 159/101     Systolic BP Percentile      Diastolic BP Percentile      Pulse Rate 80     Resp 17     Temp 98.3 F (36.8 C)     Temp Source Oral     SpO2 100 %     Weight 220 lb (99.8 kg)     Height 5\' 4"  (1.626 m)     Head Circumference      Peak Flow      Pain Score 0     Pain Loc      Pain Education      Exclude from Growth Chart     Most recent vital signs: Vitals:   12/04/23 1008  BP: (!) 159/101  Pulse: 80  Resp: 17  Temp: 98.3 F (36.8 C)  SpO2: 100%   Physical Exam: I have reviewed the vital signs and nursing notes. General: Awake, alert, no acute distress.  Nontoxic appearing. Head:  Atraumatic, normocephalic.   ENT:  EOM intact, PERRL. Oral mucosa is pink and moist with no lesions. Neck: Neck is supple with full range of motion, No meningeal signs. Cardiovascular:  RRR, No murmurs. Peripheral pulses palpable and equal bilaterally. Respiratory:  Symmetrical chest wall expansion.  No rhonchi, rales, or wheezes.  Good air movement throughout.  No use of accessory muscles.   Musculoskeletal:  No cyanosis or edema. Moving extremities with full  ROM Abdomen:  Soft, nontender, nondistended. Neuro:  GCS 15, moving all four extremities, interacting appropriately. Speech clear. Psych:  Calm, appropriate.   Skin:  Warm, dry, no rash.    ED Results / Procedures / Treatments   Labs (all labs ordered are listed, but only abnormal results are displayed) Labs Reviewed  CBC WITH DIFFERENTIAL/PLATELET - Abnormal; Notable for the following components:      Result Value   MCH 25.8 (*)    All other components within normal limits  BASIC METABOLIC PANEL - Abnormal; Notable for the following components:   Glucose, Bld 103 (*)    All other components within normal limits  MAGNESIUM  TSH  T4, FREE     EKG My EKG interpretation: Rate of 65, normal sinus rhythm, normal axis, normal intervals.  No acute ST elevations or depressions   RADIOLOGY    PROCEDURES:  Critical Care performed: No  Procedures   MEDICATIONS ORDERED IN ED: Medications - No data to display   IMPRESSION / MDM / ASSESSMENT AND PLAN / ED COURSE  I reviewed the triage vital signs and the nursing notes.  Differential diagnosis includes, but is not limited to, electrolyte abnormality, cardiac arrhythmia, hypothyroidism, anxiety  Patient's presentation is most consistent with acute complicated illness / injury requiring diagnostic workup.  Patient is a 43 year old female presenting today for intermittent episodes of tingling throughout her body associated with nausea.  Spells are very brief and do not appear triggered by any stress or anxiety as she notes she does have prior history of anxiety and panic attacks.  Physical exam and laboratory workup otherwise reassuring with no obvious electrolyte abnormalities.  EKG unremarkable.  Thyroid levels are normal.  Do not have an obvious cause of her symptoms at this time and anxiety may be playing some component, but she is otherwise stable and safe for ongoing outpatient management.  Patient  is agreeable with this plan.     FINAL CLINICAL IMPRESSION(S) / ED DIAGNOSES   Final diagnoses:  Tingling     Rx / DC Orders   ED Discharge Orders     None        Note:  This document was prepared using Dragon voice recognition software and may include unintentional dictation errors.   Janith Lima, MD 12/04/23 1228

## 2024-04-22 ENCOUNTER — Other Ambulatory Visit: Payer: Self-pay

## 2024-04-22 DIAGNOSIS — R1110 Vomiting, unspecified: Secondary | ICD-10-CM | POA: Diagnosis not present

## 2024-04-22 DIAGNOSIS — Z5321 Procedure and treatment not carried out due to patient leaving prior to being seen by health care provider: Secondary | ICD-10-CM | POA: Diagnosis not present

## 2024-04-22 DIAGNOSIS — R519 Headache, unspecified: Secondary | ICD-10-CM | POA: Diagnosis present

## 2024-04-22 LAB — CBC WITH DIFFERENTIAL/PLATELET
Abs Immature Granulocytes: 0.01 K/uL (ref 0.00–0.07)
Basophils Absolute: 0.1 K/uL (ref 0.0–0.1)
Basophils Relative: 1 %
Eosinophils Absolute: 0.1 K/uL (ref 0.0–0.5)
Eosinophils Relative: 1 %
HCT: 40.3 % (ref 36.0–46.0)
Hemoglobin: 12.9 g/dL (ref 12.0–15.0)
Immature Granulocytes: 0 %
Lymphocytes Relative: 25 %
Lymphs Abs: 1.7 K/uL (ref 0.7–4.0)
MCH: 25.6 pg — ABNORMAL LOW (ref 26.0–34.0)
MCHC: 32 g/dL (ref 30.0–36.0)
MCV: 80.1 fL (ref 80.0–100.0)
Monocytes Absolute: 0.4 K/uL (ref 0.1–1.0)
Monocytes Relative: 6 %
Neutro Abs: 4.5 K/uL (ref 1.7–7.7)
Neutrophils Relative %: 67 %
Platelets: 264 K/uL (ref 150–400)
RBC: 5.03 MIL/uL (ref 3.87–5.11)
RDW: 15 % (ref 11.5–15.5)
WBC: 6.7 K/uL (ref 4.0–10.5)
nRBC: 0 % (ref 0.0–0.2)

## 2024-04-22 LAB — COMPREHENSIVE METABOLIC PANEL WITH GFR
ALT: 26 U/L (ref 0–44)
AST: 33 U/L (ref 15–41)
Albumin: 4.2 g/dL (ref 3.5–5.0)
Alkaline Phosphatase: 82 U/L (ref 38–126)
Anion gap: 10 (ref 5–15)
BUN: 9 mg/dL (ref 6–20)
CO2: 22 mmol/L (ref 22–32)
Calcium: 8.7 mg/dL — ABNORMAL LOW (ref 8.9–10.3)
Chloride: 104 mmol/L (ref 98–111)
Creatinine, Ser: 0.79 mg/dL (ref 0.44–1.00)
GFR, Estimated: >60 mL/min (ref >60)
Glucose, Bld: 126 mg/dL — ABNORMAL HIGH (ref 70–99)
Potassium: 3.6 mmol/L (ref 3.5–5.1)
Sodium: 136 mmol/L (ref 135–145)
Total Bilirubin: 0.8 mg/dL (ref 0.0–1.2)
Total Protein: 8 g/dL (ref 6.5–8.1)

## 2024-04-22 MED ORDER — ONDANSETRON 4 MG PO TBDP
4.0000 mg | ORAL_TABLET | Freq: Once | ORAL | Status: AC
  Administered 2024-04-22: 4 mg via ORAL
  Filled 2024-04-22: qty 1

## 2024-04-22 NOTE — ED Triage Notes (Signed)
 POV with CC of headache since this am that has been ongoing. Increased noise and light sensitivity. Single episode of vomiting prior to arrival. A&Ox4. Ambulatory with steady gait.

## 2024-04-23 ENCOUNTER — Emergency Department: Admission: EM | Admit: 2024-04-23 | Discharge: 2024-04-23 | Discharge status: Against Medical Advice

## 2024-04-23 NOTE — ED Notes (Signed)
 Pt to 1st desk, stating that she is feeling better. She is leaving and if she feels bad she will go to the walk-in clinic or come back to the ER.

## 2024-04-24 ENCOUNTER — Other Ambulatory Visit: Payer: Self-pay | Admitting: Obstetrics and Gynecology

## 2024-04-24 DIAGNOSIS — Z1231 Encounter for screening mammogram for malignant neoplasm of breast: Secondary | ICD-10-CM

## 2024-05-04 ENCOUNTER — Emergency Department

## 2024-05-04 ENCOUNTER — Other Ambulatory Visit: Payer: Self-pay

## 2024-05-04 ENCOUNTER — Emergency Department
Admission: EM | Admit: 2024-05-04 | Discharge: 2024-05-04 | Disposition: A | Discharge status: Home / Self Care | Attending: Emergency Medicine | Admitting: Emergency Medicine

## 2024-05-04 ENCOUNTER — Encounter: Payer: Self-pay | Admitting: *Deleted

## 2024-05-04 DIAGNOSIS — R0789 Other chest pain: Secondary | ICD-10-CM | POA: Insufficient documentation

## 2024-05-04 DIAGNOSIS — R079 Chest pain, unspecified: Secondary | ICD-10-CM | POA: Diagnosis present

## 2024-05-04 LAB — CBC
HCT: 41.6 % (ref 36.0–46.0)
Hemoglobin: 12.4 g/dL (ref 12.0–15.0)
MCH: 26.1 pg (ref 26.0–34.0)
MCHC: 29.8 g/dL — ABNORMAL LOW (ref 30.0–36.0)
MCV: 87.4 fL (ref 80.0–100.0)
Platelets: 238 K/uL (ref 150–400)
RBC: 4.76 MIL/uL (ref 3.87–5.11)
RDW: 14.9 % (ref 11.5–15.5)
WBC: 5.9 K/uL (ref 4.0–10.5)
nRBC: 0 % (ref 0.0–0.2)

## 2024-05-04 LAB — BASIC METABOLIC PANEL WITH GFR
Anion gap: 10 (ref 5–15)
BUN: 12 mg/dL (ref 6–20)
CO2: 27 mmol/L (ref 22–32)
Calcium: 9.4 mg/dL (ref 8.9–10.3)
Chloride: 102 mmol/L (ref 98–111)
Creatinine, Ser: 0.93 mg/dL (ref 0.44–1.00)
GFR, Estimated: >60 mL/min (ref >60)
Glucose, Bld: 119 mg/dL — ABNORMAL HIGH (ref 70–99)
Potassium: 4.2 mmol/L (ref 3.5–5.1)
Sodium: 139 mmol/L (ref 135–145)

## 2024-05-04 LAB — TROPONIN I (HIGH SENSITIVITY): Troponin I (High Sensitivity): 2 ng/L (ref <18)

## 2024-05-04 NOTE — ED Triage Notes (Signed)
 Pt ambulatory to triage.  Pt has left side chest tightness for 30 minutes.   No sob.  No n/v  pt alert  speech clear.

## 2024-05-04 NOTE — ED Notes (Signed)
 Pt completely assessed by EDP and set to discharge. No RN assessment performed.  Pt provided discharge instructions and prescription information. Pt was given the opportunity to ask questions and questions were answered.

## 2024-05-04 NOTE — ED Provider Notes (Signed)
 [facility] Cooper Provider Note    Event Date/Time   First MD Initiated Contact with Patient 05/04/24 2224     (approximate)   History   Chest Pain   HPI  Mary Cooper is a 43 y.o. female with significant past history of GERD who presents with complaints of brief episode of chest tightness which occurred about 1 to 2 hours prior to arrival.  She reports it occurred while at rest and was quite brief.  She feels well now and has no complaints.  No shortness of breath.  No calf pain or swelling.  No history of heart disease.     Physical Exam   Triage Vital Signs: ED Triage Vitals  Encounter Vitals Group     BP 05/04/24 2127 119/86     Girls Systolic BP Percentile --      Girls Diastolic BP Percentile --      Boys Systolic BP Percentile --      Boys Diastolic BP Percentile --      Pulse Rate 05/04/24 2127 62     Resp 05/04/24 2127 20     Temp 05/04/24 2127 97.8 F (36.6 C)     Temp Source 05/04/24 2127 Oral     SpO2 05/04/24 2127 98 %     Weight 05/04/24 2124 104.3 kg (230 lb)     Height 05/04/24 2124 1.626 m (5' 4)     Head Circumference --      Peak Flow --      Pain Score 05/04/24 2124 3     Pain Loc --      Pain Education --      Exclude from Growth Chart --     Most recent vital signs: Vitals:   05/04/24 2127 05/04/24 2230  BP: 119/86 114/77  Pulse: 62 (!) 59  Resp: 20 15  Temp: 97.8 F (36.6 C)   SpO2: 98% 98%     General: Awake, no distress.  CV:  Good peripheral perfusion.  Regular rate and rhythm Resp:  Normal effort.  CTAB Abd:  No distention.  Other:  No calf pain or swelling   ED Results / Procedures / Treatments   Labs (all labs ordered are listed, but only abnormal results are displayed) Labs Reviewed  BASIC METABOLIC PANEL WITH GFR - Abnormal; Notable for the following components:      Result Value   Glucose, Bld 119 (*)    All other components within normal limits  CBC - Abnormal; Notable for the following  components:   MCHC 29.8 (*)    All other components within normal limits  TROPONIN I (HIGH SENSITIVITY)  TROPONIN I (HIGH SENSITIVITY)     EKG  ED ECG REPORT I, Lamar Price, the attending physician, personally viewed and interpreted this ECG.  Date: 05/04/2024  Rhythm: normal sinus rhythm QRS Axis: normal Intervals: normal ST/T Wave abnormalities: normal Narrative Interpretation: no evidence of acute ischemia    RADIOLOGY Chest x-ray viewed interpret by me, no acute abnormality    PROCEDURES:  Critical Care performed:   Procedures   MEDICATIONS ORDERED IN ED: Medications - No data to display   IMPRESSION / MDM / ASSESSMENT AND PLAN / ED COURSE  I reviewed the triage vital signs and the nursing notes. Patient's presentation is most consistent with acute presentation with potential threat to life or bodily function.  Patient presents with brief episode of chest tightness as detailed above, differential includes musculoskeletal chest discomfort,  GERD, less likely ACS  EKG is normal, high sensitive troponin is normal.  Chest x-ray without acute normality.  Remainder of labs are reassuring  Patient remains asymptomatic here in the emergency department.  No indication for admission at this time, will refer to cardiology strict return precautions, she agrees with this plan        FINAL CLINICAL IMPRESSION(S) / ED DIAGNOSES   Final diagnoses:  Atypical chest pain     Rx / DC Orders   ED Discharge Orders          Ordered    Ambulatory referral to Cardiology       Comments: If you have not heard from the Cardiology office within the next 72 hours please call [phone].   05/04/24 2237             Note:  This document was prepared using Dragon voice recognition software and may include unintentional dictation errors.   Arlander Charleston, MD 05/04/24 2308

## 2024-05-08 ENCOUNTER — Ambulatory Visit
Admission: RE | Admit: 2024-05-08 | Discharge: 2024-05-08 | Disposition: A | Discharge status: Home / Self Care | Source: Ambulatory Visit | Attending: Obstetrics and Gynecology | Admitting: Obstetrics and Gynecology

## 2024-05-08 DIAGNOSIS — Z1231 Encounter for screening mammogram for malignant neoplasm of breast: Secondary | ICD-10-CM | POA: Insufficient documentation

## 2024-05-12 ENCOUNTER — Ambulatory Visit: Payer: Self-pay | Admitting: Obstetrics and Gynecology

## 2024-06-04 NOTE — Progress Notes (Unsigned)
  Cardiology Office Note   Date:  06/05/2024  ID:  CHAISE PASSARELLA, DOB [birth year], MRN [id number] PCP: Valora Agent, MD  [facility] Providers Cardiologist:  Caron Poser, MD     History of Present Illness CALDONIA LEAP is a 43 y.o. female PMH obesity, GERD who presents for further evaluation and management of chest pain.  Patient seen in the ED on 05/04/2024 for chest discomfort.  Troponin was negative and labs were otherwise unremarkable.  Patient reports that the episode of chest discomfort has only happened 1 time so far.  This sensation came on suddenly and lasted for a few hours before abating.  Sharp in Editor, commissioning.  She has not had any further episodes since.  She denies any family history of premature ASCVD.  She is a non-smoker.  Relevant CVD History -None   ROS: Pt denies any chest discomfort, jaw pain, arm pain, palpitations, syncope, presyncope, orthopnea, PND, or LE edema.  Studies Reviewed I have independently reviewed the patient's ECG, recent blood work, and recent medical records from her ED visit on 05/04/2024.  Physical Exam VS:  BP 126/80 (BP Location: Right Arm, Patient Position: Sitting, Cuff Size: Normal)   Pulse 68   Ht 5' 3.5 (1.613 m)   Wt 223 lb (101.2 kg)   SpO2 97%   BMI 38.88 kg/m        Wt Readings from Last 3 Encounters:  06/05/24 223 lb (101.2 kg)  05/04/24 230 lb (104.3 kg)  04/22/24 220 lb (99.8 kg)    GEN: No acute distress. NECK: No JVD; No carotid bruits. CARDIAC: RRR, no murmurs, rubs, gallops. RESPIRATORY:  Clear to auscultation. EXTREMITIES:  Warm and well-perfused. No edema.  ASSESSMENT AND PLAN Chest discomfort Preventative health care Patient presents with a single episode of chest pain with low pretest probability of cardiac etiology.  The pain came on randomly and lasted for a few hours and was nonexertional.  She has no other high risk symptoms and has not had any recurrence.  Suspect this is probably noncardiac  in etiology.  She is otherwise of low pretest probability for obstructive CAD.  After a shared decision-making discussion, we elected to proceed with a watchful waiting approach.  Plan: - Watchful waiting approach for now; if she has recurrence of symptoms or other concerns, we can obtain a coronary CT angiogram.  I think this would be definitive in ruling out obstructive CAD. - In the meantime, we should just optimize her modifiable risk factors.  I recommended she obtain a lipid profile and keep an eye on her blood pressures, blood sugars, and not to start smoking again.        Dispo: RTC 1 year or sooner as needed  Signed, Caron Poser, MD

## 2024-06-05 ENCOUNTER — Ambulatory Visit

## 2024-06-05 VITALS — BP 126/80 | HR 68 | Ht 63.5 in | Wt 223.0 lb

## 2024-06-05 DIAGNOSIS — Z0000 Encounter for general adult medical examination without abnormal findings: Secondary | ICD-10-CM

## 2024-06-05 DIAGNOSIS — R079 Chest pain, unspecified: Secondary | ICD-10-CM | POA: Diagnosis present

## 2024-06-05 NOTE — Patient Instructions (Signed)
 Medication Instructions:  Your physician recommends that you continue on your current medications as directed. Please refer to the Current Medication list given to you today.  *If you need a refill on your cardiac medications before your next appointment, please call your pharmacy*  Lab Work: No labs ordered today  If you have labs (blood work) drawn today and your tests are completely normal, you will receive your results only by: MyChart Message (if you have MyChart) OR A paper copy in the mail If you have any lab test that is abnormal or we need to change your treatment, we will call you to review the results.  Testing/Procedures: No test ordered today   Follow-Up: At [facility], you and your health needs are our priority.  As part of our continuing mission to provide you with exceptional heart care, our providers are all part of one team.  This team includes your primary Cardiologist (physician) and Advanced Practice Providers or APPs (Physician Assistants and Nurse Practitioners) who all work together to provide you with the care you need, when you need it.  Your next appointment:   12 month(s)  Provider:    You may see Mary Poser, MD   We recommend signing up for the patient portal called MyChart.  Sign up information is provided on this After Visit Summary.  MyChart is used to connect with patients for Virtual Visits (Telemedicine).  Patients are able to view lab/test results, encounter notes, upcoming appointments, etc.  Non-urgent messages can be sent to your provider as well.   To learn more about what you can do with MyChart, go to [email or web address].

## 2024-07-08 NOTE — Progress Notes (Signed)
 Subjective:    Chief Complaint  Patient presents with  . Sore Throat    9/30 Pt reports chills, fatigue, body aches, fever, some nausea, mild frontal HA, right ear ache  Denies cough, congestion, v/d Denies CP, SOB, dyspnea  Took tylenol  around 900 am    History was provided by the patient. Mary Cooper is a 43 y.o., female  who presents with onset of sore throat, fever, chills, body aches, right ear pain, right anterior cervical adenitis since yesterday.  Patient here with several children, 1 of whom is being seen with complaints of sore throat, cough, nausea, vomiting, fatigue and malaise for 3 days.  Patient denies complaints of dizziness; no chest pain or shortness of breath; no vomiting or diarrhea.  Taking Tylenol  with limited benefit.  Reports very little to no cough. Symptoms include: above   Patient denies: above Treatment to date: above   Past Medical History:  Diagnosis Date  . Allergic state   . Fracture of lower end of tibia, unspecified laterality, open type III, initial encounter   . Panic disorder   . UTI (urinary tract infection)    The following portions of the patient's history were reviewed and updated as appropriate: allergies, current medications, past social history, and problem list.  Review of Systems A complete review of systems was performed.  Positive and pertinent negative responses are documented in the HPI, and all other systems are negative.   Objective:     Vitals:   07/08/24 1631  BP: 122/76  Pulse: 92  Temp: 37.8 C (100 F)  TempSrc: Oral  SpO2: 98%  Weight: 99.8 kg (220 lb)  Height: 162.6 cm (5' 4)  PainSc:   3  PainLoc: Throat    General Appearance: Moderately obese.  Pleasant and cooperative.  No acute distress.  3 children, 1 of whom is also being seen. HEENT:  [place]/AT, PERRLA, EOMI, sclera clear, conjunctivae pink.   Mouth and throat: Posterior oropharynx marked erythema, drainage; tongue midline. TM's: Effusions bilaterally,  right greater than left.  Neck:  Supple, no JVD.  Moderate right anterior cervical adenitis. Cardiovascular:  Regular rate and rhythm.  Lungs:  Clear to auscultation.  Abdomen: Benign. Extremities:   No cyanosis, clubbing, or edema. Neuro:  Alert and orient x4; nonfocal.    Lab/X-ray/Treatments:   RST-positive Extended resp viral panel-negative       Assessment:      1. Acute bacterial sinusitis -     amoxicillin -clavulanate (AUGMENTIN ) 875-125 mg tablet; Take 1 tablet (875 mg total) by mouth 2 (two) times daily for 10 days  Dispense: 20 tablet; Refill: 0 -     predniSONE (DELTASONE) 20 MG tablet; Take 1 tablet (20 mg total) by mouth 2 (two) times daily for 5 days  Dispense: 10 tablet; Refill: 0 -     HYDROcodone -homatropine (HYCODAN) 5-1.5 mg/5 mL syrup; Take 5 mLs by mouth every 6 (six) hours as needed for Cough for up to 10 days  Dispense: 60 mL; Refill: 0  2. Acute suppurative otitis media of right ear without spontaneous rupture of tympanic membrane, recurrence not specified -     amoxicillin -clavulanate (AUGMENTIN ) 875-125 mg tablet; Take 1 tablet (875 mg total) by mouth 2 (two) times daily for 10 days  Dispense: 20 tablet; Refill: 0 -     predniSONE (DELTASONE) 20 MG tablet; Take 1 tablet (20 mg total) by mouth 2 (two) times daily for 5 days  Dispense: 10 tablet; Refill: 0 -  HYDROcodone -homatropine (HYCODAN) 5-1.5 mg/5 mL syrup; Take 5 mLs by mouth every 6 (six) hours as needed for Cough for up to 10 days  Dispense: 60 mL; Refill: 0  3. Strep pharyngitis -     amoxicillin -clavulanate (AUGMENTIN ) 875-125 mg tablet; Take 1 tablet (875 mg total) by mouth 2 (two) times daily for 10 days  Dispense: 20 tablet; Refill: 0  4. Sore throat -     Extended Respiratory Viral Panel - [facility]; Future -     Xpert Dx Strep A, PCR - [facility]   Augmentin , guaifenesin, prednisone, Hycodan.  Fluids and rest.  Work note provided.  Call return if any further problems.    Plan:    Requested Prescriptions   Signed Prescriptions Disp Refills  . amoxicillin -clavulanate (AUGMENTIN ) 875-125 mg tablet 20 tablet 0    Sig: Take 1 tablet (875 mg total) by mouth 2 (two) times daily for 10 days  . predniSONE (DELTASONE) 20 MG tablet 10 tablet 0    Sig: Take 1 tablet (20 mg total) by mouth 2 (two) times daily for 5 days  . HYDROcodone -homatropine (HYCODAN) 5-1.5 mg/5 mL syrup 60 mL 0    Sig: Take 5 mLs by mouth every 6 (six) hours as needed for Cough for up to 10 days

## 2024-09-02 ENCOUNTER — Emergency Department
Admission: EM | Admit: 2024-09-02 | Discharge: 2024-09-02 | Disposition: A | Discharge status: Home / Self Care | Attending: Emergency Medicine | Admitting: Emergency Medicine

## 2024-09-02 ENCOUNTER — Encounter: Payer: Self-pay | Admitting: *Deleted

## 2024-09-02 ENCOUNTER — Other Ambulatory Visit: Payer: Self-pay

## 2024-09-02 DIAGNOSIS — G43909 Migraine, unspecified, not intractable, without status migrainosus: Secondary | ICD-10-CM | POA: Insufficient documentation

## 2024-09-02 DIAGNOSIS — R519 Headache, unspecified: Secondary | ICD-10-CM | POA: Diagnosis present

## 2024-09-02 LAB — CBC WITH DIFFERENTIAL/PLATELET
Abs Immature Granulocytes: 0.03 K/uL (ref 0.00–0.07)
Basophils Absolute: 0 K/uL (ref 0.0–0.1)
Basophils Relative: 0 %
Eosinophils Absolute: 0 K/uL (ref 0.0–0.5)
Eosinophils Relative: 1 %
HCT: 42.2 % (ref 36.0–46.0)
Hemoglobin: 13.4 g/dL (ref 12.0–15.0)
Immature Granulocytes: 0 %
Lymphocytes Relative: 16 %
Lymphs Abs: 1.1 K/uL (ref 0.7–4.0)
MCH: 26.1 pg (ref 26.0–34.0)
MCHC: 31.8 g/dL (ref 30.0–36.0)
MCV: 82.3 fL (ref 80.0–100.0)
Monocytes Absolute: 0.2 K/uL (ref 0.1–1.0)
Monocytes Relative: 3 %
Neutro Abs: 5.3 K/uL (ref 1.7–7.7)
Neutrophils Relative %: 80 %
Platelets: 304 K/uL (ref 150–400)
RBC: 5.13 MIL/uL — ABNORMAL HIGH (ref 3.87–5.11)
RDW: 14.6 % (ref 11.5–15.5)
WBC: 6.7 K/uL (ref 4.0–10.5)
nRBC: 0 % (ref 0.0–0.2)

## 2024-09-02 LAB — BASIC METABOLIC PANEL WITH GFR
Anion gap: 11 (ref 5–15)
BUN: 7 mg/dL (ref 6–20)
CO2: 25 mmol/L (ref 22–32)
Calcium: 8.7 mg/dL — ABNORMAL LOW (ref 8.9–10.3)
Chloride: 100 mmol/L (ref 98–111)
Creatinine, Ser: 0.65 mg/dL (ref 0.44–1.00)
GFR, Estimated: >60 mL/min (ref >60)
Glucose, Bld: 151 mg/dL — ABNORMAL HIGH (ref 70–99)
Potassium: 4 mmol/L (ref 3.5–5.1)
Sodium: 136 mmol/L (ref 135–145)

## 2024-09-02 MED ORDER — KETOROLAC TROMETHAMINE 15 MG/ML IJ SOLN
15.0000 mg | Freq: Once | INTRAMUSCULAR | Status: AC
  Administered 2024-09-02: 15 mg via INTRAVENOUS
  Filled 2024-09-02: qty 1

## 2024-09-02 MED ORDER — DIPHENHYDRAMINE HCL 50 MG/ML IJ SOLN
25.0000 mg | Freq: Once | INTRAMUSCULAR | Status: AC
  Administered 2024-09-02: 25 mg via INTRAVENOUS
  Filled 2024-09-02: qty 1

## 2024-09-02 MED ORDER — SODIUM CHLORIDE 0.9 % IV BOLUS
1000.0000 mL | Freq: Once | INTRAVENOUS | Status: AC
Start: 2024-09-02 — End: 2024-09-02
  Administered 2024-09-02: 1000 mL via INTRAVENOUS

## 2024-09-02 MED ORDER — METOCLOPRAMIDE HCL 10 MG PO TABS: 10.0000 mg | ORAL_TABLET | Freq: Three times a day (TID) | ORAL | 0 refills | Status: AC | PRN

## 2024-09-02 MED ORDER — METOCLOPRAMIDE HCL 5 MG/ML IJ SOLN
10.0000 mg | Freq: Once | INTRAMUSCULAR | Status: AC
  Administered 2024-09-02: 10 mg via INTRAVENOUS
  Filled 2024-09-02: qty 2

## 2024-09-02 NOTE — Discharge Instructions (Signed)
 You may take the Reglan  as needed for headache or nausea over the next 1 or 2 days.  Drink plenty of fluids.  Return to the ER immediately for new, worsening, or persistent severe headache, dizziness, nausea or vomiting, neck stiffness, fever, blurred vision, or any other new or worsening symptoms that concern you.

## 2024-09-02 NOTE — ED Provider Notes (Signed)
 [facility] Provider Note    Event Date/Time   First MD Initiated Contact with Patient 09/02/24 1826     (approximate)   History   Headache   HPI  Mary Cooper is a 43 y.o. female with a history of GERD and panic disorder who presents with headache, acute onset this morning, gradually worsening over the course of the day, initially left-sided but now more bilateral, both steady and throbbing in quality.  She reports associated nausea and multiple episodes of vomiting.  She has mild photophobia.  She states that she had headache like this about 6 months ago when she was out on the beach all day and thinks that she was dehydrated at that time.  She denies any chest pain or difficulty breathing.  She has no abdominal pain or diarrhea.  She denies any body aches.  She has had some hot flashes today.   I reviewed the past medical records.  The patient's most recent outpatient encounter was on 10/1 with internal medicine for evaluation of sore throat fever and chills.  She was diagnosed with sinusitis at that time.  Physical Exam   Triage Vital Signs: ED Triage Vitals  Encounter Vitals Group     BP 09/02/24 1801 (!) 140/97     Girls Systolic BP Percentile --      Girls Diastolic BP Percentile --      Boys Systolic BP Percentile --      Boys Diastolic BP Percentile --      Pulse Rate 09/02/24 1801 65     Resp 09/02/24 1801 18     Temp 09/02/24 1801 98 F (36.7 C)     Temp Source 09/02/24 1801 Oral     SpO2 09/02/24 1801 99 %     Weight 09/02/24 1802 230 lb (104.3 kg)     Height 09/02/24 1802 5' 4 (1.626 m)     Head Circumference --      Peak Flow --      Pain Score 09/02/24 1802 8     Pain Loc --      Pain Education --      Exclude from Growth Chart --     Most recent vital signs: Vitals:   09/02/24 1801  BP: (!) 140/97  Pulse: 65  Resp: 18  Temp: 98 F (36.7 C)  SpO2: 99%     General: Alert, somewhat uncomfortable appearing but in no  distress.  CV:  Good peripheral perfusion.  Resp:  Normal effort.  Abd:  No distention. Other:  EOMI.  PERRLA.  No significant photophobia.  Normal speech.  No facial droop.  Motor intact in all extremities.  Normal gait.  No ataxia on finger-to-nose.  Neck supple with full ROM.  No meningeal signs.   ED Results / Procedures / Treatments   Labs (all labs ordered are listed, but only abnormal results are displayed) Labs Reviewed  BASIC METABOLIC PANEL WITH GFR - Abnormal; Notable for the following components:      Result Value   Glucose, Bld 151 (*)    Calcium 8.7 (*)    All other components within normal limits  CBC WITH DIFFERENTIAL/PLATELET - Abnormal; Notable for the following components:   RBC 5.13 (*)    All other components within normal limits     EKG   RADIOLOGY   PROCEDURES:  Critical Care performed: No  Procedures   MEDICATIONS ORDERED IN ED: Medications  sodium chloride  0.9 %  bolus 1,000 mL (1,000 mLs Intravenous New Bag/Given 09/02/24 1851)  metoCLOPramide  (REGLAN ) injection 10 mg (10 mg Intravenous Given 09/02/24 1852)  diphenhydrAMINE  (BENADRYL ) injection 25 mg (25 mg Intravenous Given 09/02/24 1854)  ketorolac  (TORADOL ) 15 MG/ML injection 15 mg (15 mg Intravenous Given 09/02/24 1853)     IMPRESSION / MDM / ASSESSMENT AND PLAN / ED COURSE  I reviewed the triage vital signs and the nursing notes.  43 year old female with PMH as noted above presents with a headache since this morning associated with nausea and vomiting.  She has had a headache like this at least once previously.  On exam her vital signs are normal except for borderline hypertension.  Thorough neurologic exam is nonfocal.  Differential diagnosis includes, but is not limited to, migraine, tension headache, cluster headache, dehydration, viral syndrome.  There is no evidence of meningitis.  Given the patient's age, nonfocal neuro exam, and the history of similar headache in the past, there  is no indication for imaging at this time.  We will obtain basic labs, give a fluid bolus, migraine cocktail, and reassess.  Patient's presentation is most consistent with acute complicated illness / injury requiring diagnostic workup.  ----------------------------------------- 7:39 PM on 09/02/2024 -----------------------------------------  BMP and CBC are unremarkable.  On reassessment the patient reports significant relief.  She feels much better.  She feels comfortable going home.  She is stable for discharge at this time.  I counseled her on the results of the workup.  Overall presentation is consistent with migraine.  I gave strict return precautions, and she expresses understanding.  FINAL CLINICAL IMPRESSION(S) / ED DIAGNOSES   Final diagnoses:  Migraine without status migrainosus, not intractable, unspecified migraine type     Rx / DC Orders   ED Discharge Orders          Ordered    metoCLOPramide  (REGLAN ) 10 MG tablet  Every 8 hours PRN        09/02/24 1939             Note:  This document was prepared using Dragon voice recognition software and may include unintentional dictation errors.    Jacolyn Pae, MD 09/02/24 1940

## 2024-09-02 NOTE — ED Triage Notes (Addendum)
 Pt ambulatory to triage with steady gait.  Pt reports she woke at 10am with a headache.  Pt has not taken any otc meds for headache.  Pt has n/v.  No dizziness.    Pt tearful in triage.  Pt alert  speech clear.

## 2024-09-09 ENCOUNTER — Emergency Department
Admission: EM | Admit: 2024-09-09 | Discharge: 2024-09-09 | Disposition: A | Discharge status: Home / Self Care | Attending: Emergency Medicine | Admitting: Emergency Medicine

## 2024-09-09 ENCOUNTER — Other Ambulatory Visit: Payer: Self-pay

## 2024-09-09 ENCOUNTER — Emergency Department

## 2024-09-09 DIAGNOSIS — G43809 Other migraine, not intractable, without status migrainosus: Secondary | ICD-10-CM | POA: Insufficient documentation

## 2024-09-09 DIAGNOSIS — R519 Headache, unspecified: Secondary | ICD-10-CM | POA: Diagnosis present

## 2024-09-09 LAB — COMPREHENSIVE METABOLIC PANEL WITH GFR
ALT: 20 U/L (ref 0–44)
AST: 24 U/L (ref 15–41)
Albumin: 4.4 g/dL (ref 3.5–5.0)
Alkaline Phosphatase: 83 U/L (ref 38–126)
Anion gap: 13 (ref 5–15)
BUN: 10 mg/dL (ref 6–20)
CO2: 26 mmol/L (ref 22–32)
Calcium: 9.4 mg/dL (ref 8.9–10.3)
Chloride: 100 mmol/L (ref 98–111)
Creatinine, Ser: 0.75 mg/dL (ref 0.44–1.00)
GFR, Estimated: >60 mL/min (ref >60)
Glucose, Bld: 155 mg/dL — ABNORMAL HIGH (ref 70–99)
Potassium: 3.3 mmol/L — ABNORMAL LOW (ref 3.5–5.1)
Sodium: 138 mmol/L (ref 135–145)
Total Bilirubin: 0.3 mg/dL (ref 0.0–1.2)
Total Protein: 7.3 g/dL (ref 6.5–8.1)

## 2024-09-09 LAB — CBC
HCT: 38.6 % (ref 36.0–46.0)
Hemoglobin: 12.1 g/dL (ref 12.0–15.0)
MCH: 26.2 pg (ref 26.0–34.0)
MCHC: 31.3 g/dL (ref 30.0–36.0)
MCV: 83.7 fL (ref 80.0–100.0)
Platelets: 253 K/uL (ref 150–400)
RBC: 4.61 MIL/uL (ref 3.87–5.11)
RDW: 14.6 % (ref 11.5–15.5)
WBC: 7 K/uL (ref 4.0–10.5)
nRBC: 0 % (ref 0.0–0.2)

## 2024-09-09 LAB — URINALYSIS, ROUTINE W REFLEX MICROSCOPIC
Bilirubin Urine: NEGATIVE
Glucose, UA: NEGATIVE mg/dL
Hgb urine dipstick: NEGATIVE
Ketones, ur: NEGATIVE mg/dL
Leukocytes,Ua: NEGATIVE
Nitrite: NEGATIVE
Protein, ur: NEGATIVE mg/dL
Specific Gravity, Urine: 1.026 (ref 1.005–1.030)
pH: 5 (ref 5.0–8.0)

## 2024-09-09 LAB — POC URINE PREG, ED: Preg Test, Ur: NEGATIVE

## 2024-09-09 NOTE — ED Notes (Signed)
 AVS provided by edp was reviewed with the pt. Pt verbalized understanding with no additional questions at this time. Pt departing with family

## 2024-09-09 NOTE — ED Notes (Signed)
 Pt to CT at this time.

## 2024-09-09 NOTE — ED Notes (Signed)
 Pt back from CT

## 2024-09-09 NOTE — ED Triage Notes (Signed)
 Pt ambulatory to triage. She c/o headache this evening. She says she was looking at the clock after dinner, and the numbers became blurry, some  and shakiness,lasting 2-3 minutes. En route to ED she had onset of some left neck pain. Denies n/v.   She reports seen last week for migraines, she has had lightheadedness on and off since then and felt more anxious since then.   Speech clear, no current vision issues. Denies any weakness, or difficulty speaking.

## 2024-09-09 NOTE — ED Provider Notes (Signed)
 [facility] Provider Note    Event Date/Time   First MD Initiated Contact with Patient 09/09/24 2144     (approximate)   History   Headache   HPI  Mary Cooper is a 43 y.o. female who presents to the emergency department today because of concerns for headache and vision change.  Patient states that she was in the kitchen when she started feeling lightheaded.  At that time she noticed her vision started to become blurry.  This was then associated with a headache.  She also had some left neck pain.  At the time of my exam the patient is feeling significantly improved.  She states that her headache is at a manageable level.  She no longer has any vision change or neck pain.  She was seen in the emergency department about a week ago for headache. She denies any fevers.      Physical Exam   Triage Vital Signs: ED Triage Vitals  Encounter Vitals Group     BP 09/09/24 2040 (!) 146/95     Girls Systolic BP Percentile --      Girls Diastolic BP Percentile --      Boys Systolic BP Percentile --      Boys Diastolic BP Percentile --      Pulse Rate 09/09/24 2040 75     Resp 09/09/24 2040 16     Temp 09/09/24 2040 98.2 F (36.8 C)     Temp Source 09/09/24 2040 Oral     SpO2 09/09/24 2040 97 %     Weight --      Height --      Head Circumference --      Peak Flow --      Pain Score 09/09/24 2042 5     Pain Loc --      Pain Education --      Exclude from Growth Chart --     Most recent vital signs: Vitals:   09/09/24 2040  BP: (!) 146/95  Pulse: 75  Resp: 16  Temp: 98.2 F (36.8 C)  SpO2: 97%   General: Awake, alert, oriented. CV:  Good peripheral perfusion. Regular rate and rhythm. Resp:  Normal effort. Lungs clear. Abd:  No distention.   ED Results / Procedures / Treatments   Labs (all labs ordered are listed, but only abnormal results are displayed) Labs Reviewed  COMPREHENSIVE METABOLIC PANEL WITH GFR - Abnormal; Notable for the  following components:      Result Value   Potassium 3.3 (*)    Glucose, Bld 155 (*)    All other components within normal limits  URINALYSIS, ROUTINE W REFLEX MICROSCOPIC - Abnormal; Notable for the following components:   Color, Urine YELLOW (*)    APPearance CLOUDY (*)    All other components within normal limits  CBC  POC URINE PREG, ED     EKG  None   RADIOLOGY I independently interpreted and visualized the CT head. My interpretation: No ICH Radiology interpretation:  IMPRESSION:  1. No acute intracranial abnormality.  2. Fluid level in the right maxillary sinus, possibly indicating acute  sinusitis.     PROCEDURES:  Critical Care performed: No    MEDICATIONS ORDERED IN ED: Medications - No data to display   IMPRESSION / MDM / ASSESSMENT AND PLAN / ED COURSE  I reviewed the triage vital signs and the nursing notes.  Differential diagnosis includes, but is not limited to, ICH, intracranial mass, migraine  Patient's presentation is most consistent with acute presentation with potential threat to life or bodily function.  Patient presented to the emergency department today because of concerns for headache, vision changes and lightheadedness.  At the time my exam she does feel significant improvement.  Blood work without concerning electrolyte abnormalities or anemia.  Given this is now her second presentation to the emergency department did obtain a head CT to evaluate for any intracranial mass or ICH.  This was negative.  I do think patient might be suffering from migraines.  This time I think is reasonable for patient be discharged.  Will give patient primary care follow-up information.     FINAL CLINICAL IMPRESSION(S) / ED DIAGNOSES   Final diagnoses:  Other migraine without status migrainosus, not intractable     Note:  This document was prepared using Dragon voice recognition software and may include unintentional dictation  errors.    Floy Roberts, MD 09/09/24 [phone]

## 2024-09-09 NOTE — ED Notes (Signed)
 EDP at bedside
